# Patient Record
Sex: Female | Born: 1945 | Race: White | Hispanic: No | Smoking: Former smoker
Health system: Southern US, Community
[De-identification: ages and names within clinical notes are randomized; demographics above are authoritative.]

## PROBLEM LIST (undated history)

## (undated) DIAGNOSIS — J189 Pneumonia, unspecified organism: Secondary | ICD-10-CM

## (undated) DIAGNOSIS — R519 Headache, unspecified: Secondary | ICD-10-CM

## (undated) DIAGNOSIS — R011 Cardiac murmur, unspecified: Secondary | ICD-10-CM

## (undated) DIAGNOSIS — I10 Essential (primary) hypertension: Secondary | ICD-10-CM

## (undated) DIAGNOSIS — Z5189 Encounter for other specified aftercare: Secondary | ICD-10-CM

## (undated) DIAGNOSIS — C50919 Malignant neoplasm of unspecified site of unspecified female breast: Secondary | ICD-10-CM

## (undated) DIAGNOSIS — IMO0002 Reserved for concepts with insufficient information to code with codable children: Secondary | ICD-10-CM

## (undated) DIAGNOSIS — D649 Anemia, unspecified: Secondary | ICD-10-CM

## (undated) DIAGNOSIS — M1990 Unspecified osteoarthritis, unspecified site: Secondary | ICD-10-CM

## (undated) HISTORY — DX: Encounter for other specified aftercare: Z51.89

## (undated) HISTORY — PX: LUMBAR DISC SURGERY: SHX700

## (undated) HISTORY — DX: Malignant neoplasm of unspecified site of unspecified female breast: C50.919

## (undated) HISTORY — PX: KNEE SURGERY: SHX244

## (undated) HISTORY — DX: Anemia, unspecified: D64.9

## (undated) HISTORY — PX: ABDOMINAL HYSTERECTOMY: SHX81

## (undated) HISTORY — PX: BACK SURGERY: SHX140

## (undated) HISTORY — PX: APPENDECTOMY: SHX54

## (undated) HISTORY — DX: Reserved for concepts with insufficient information to code with codable children: IMO0002

## (undated) HISTORY — DX: Essential (primary) hypertension: I10

## (undated) HISTORY — DX: Cardiac murmur, unspecified: R01.1

## (undated) HISTORY — DX: Unspecified osteoarthritis, unspecified site: M19.90

---

## 1974-05-25 HISTORY — PX: ABDOMINAL HYSTERECTOMY: SHX81

## 1974-05-25 HISTORY — PX: APPENDECTOMY: SHX54

## 1993-05-25 HISTORY — PX: TIBIA FRACTURE SURGERY: SHX806

## 1993-05-25 HISTORY — PX: FRACTURE SURGERY: SHX138

## 1997-11-18 ENCOUNTER — Emergency Department (HOSPITAL_COMMUNITY): Admission: EM | Admit: 1997-11-18 | Discharge: 1997-11-18 | Payer: Self-pay | Admitting: Emergency Medicine

## 2005-12-03 ENCOUNTER — Ambulatory Visit (HOSPITAL_COMMUNITY): Admission: RE | Admit: 2005-12-03 | Discharge: 2005-12-04 | Payer: Self-pay | Admitting: Neurosurgery

## 2008-07-11 ENCOUNTER — Ambulatory Visit (HOSPITAL_COMMUNITY): Admission: RE | Admit: 2008-07-11 | Discharge: 2008-07-11 | Payer: Self-pay | Admitting: Gastroenterology

## 2010-05-25 DIAGNOSIS — K219 Gastro-esophageal reflux disease without esophagitis: Secondary | ICD-10-CM

## 2010-05-25 HISTORY — DX: Gastro-esophageal reflux disease without esophagitis: K21.9

## 2010-09-09 LAB — CROSSMATCH
ABO/RH(D): O POS
Antibody Screen: NEGATIVE

## 2010-09-09 LAB — ABO/RH: ABO/RH(D): O POS

## 2010-09-09 LAB — GLUCOSE, CAPILLARY: Glucose-Capillary: 110 mg/dL — ABNORMAL HIGH (ref 70–99)

## 2011-05-27 ENCOUNTER — Telehealth: Payer: Self-pay | Admitting: Hematology and Oncology

## 2011-05-27 NOTE — Telephone Encounter (Signed)
lmonvm for pt re calling me for appt w/LO 

## 2011-05-28 ENCOUNTER — Telehealth: Payer: Self-pay | Admitting: Hematology and Oncology

## 2011-05-28 NOTE — Telephone Encounter (Signed)
Pt returned my call and was given appt for 1/11 @ 9:30 am w/LO.

## 2011-05-29 ENCOUNTER — Other Ambulatory Visit: Payer: Self-pay | Admitting: Gastroenterology

## 2011-05-29 ENCOUNTER — Telehealth: Payer: Self-pay | Admitting: Hematology and Oncology

## 2011-05-29 ENCOUNTER — Ambulatory Visit
Admission: RE | Admit: 2011-05-29 | Discharge: 2011-05-29 | Disposition: A | Discharge status: Home / Self Care | Payer: Medicare Other | Source: Ambulatory Visit | Attending: Gastroenterology | Admitting: Gastroenterology

## 2011-05-29 DIAGNOSIS — Z5189 Encounter for other specified aftercare: Secondary | ICD-10-CM | POA: Diagnosis not present

## 2011-05-29 DIAGNOSIS — D649 Anemia, unspecified: Secondary | ICD-10-CM

## 2011-05-29 DIAGNOSIS — T189XXA Foreign body of alimentary tract, part unspecified, initial encounter: Secondary | ICD-10-CM

## 2011-05-29 NOTE — Telephone Encounter (Signed)
Referred by Dr. Charlott Rakes Dx- IDA

## 2011-06-05 ENCOUNTER — Ambulatory Visit: Payer: Medicare Other

## 2011-06-05 ENCOUNTER — Other Ambulatory Visit: Payer: Self-pay | Admitting: Hematology and Oncology

## 2011-06-05 ENCOUNTER — Ambulatory Visit (HOSPITAL_BASED_OUTPATIENT_CLINIC_OR_DEPARTMENT_OTHER): Payer: Medicare Other | Admitting: Hematology and Oncology

## 2011-06-05 ENCOUNTER — Ambulatory Visit (HOSPITAL_BASED_OUTPATIENT_CLINIC_OR_DEPARTMENT_OTHER): Payer: Medicare Other

## 2011-06-05 ENCOUNTER — Telehealth: Payer: Self-pay | Admitting: Hematology and Oncology

## 2011-06-05 VITALS — BP 169/76 | HR 85 | Temp 98.2°F | Ht 61.5 in | Wt 174.9 lb

## 2011-06-05 DIAGNOSIS — D509 Iron deficiency anemia, unspecified: Secondary | ICD-10-CM

## 2011-06-05 DIAGNOSIS — Z8719 Personal history of other diseases of the digestive system: Secondary | ICD-10-CM | POA: Diagnosis not present

## 2011-06-05 DIAGNOSIS — D539 Nutritional anemia, unspecified: Secondary | ICD-10-CM | POA: Diagnosis not present

## 2011-06-05 LAB — CBC & DIFF AND RETIC
BASO%: 0.8 % (ref 0.0–2.0)
Basophils Absolute: 0.1 10e3/uL (ref 0.0–0.1)
EOS%: 1.3 % (ref 0.0–7.0)
Eosinophils Absolute: 0.1 10e3/uL (ref 0.0–0.5)
HCT: 28.3 % — ABNORMAL LOW (ref 34.8–46.6)
HGB: 8 g/dL — ABNORMAL LOW (ref 11.6–15.9)
Immature Retic Fract: 31.4 % — ABNORMAL HIGH (ref 1.60–10.00)
LYMPH%: 28.5 % (ref 14.0–49.7)
MCH: 19.5 pg — ABNORMAL LOW (ref 25.1–34.0)
MCHC: 28.3 g/dL — ABNORMAL LOW (ref 31.5–36.0)
MCV: 68.9 fL — ABNORMAL LOW (ref 79.5–101.0)
MONO#: 0.4 10e3/uL (ref 0.1–0.9)
MONO%: 6.5 % (ref 0.0–14.0)
NEUT#: 3.9 10e3/uL (ref 1.5–6.5)
NEUT%: 62.9 % (ref 38.4–76.8)
Platelets: 360 10e3/uL (ref 145–400)
RBC: 4.11 10e6/uL (ref 3.70–5.45)
RDW: 19.7 % — ABNORMAL HIGH (ref 11.2–14.5)
Retic %: 2.55 % — ABNORMAL HIGH (ref 0.70–2.10)
Retic Ct Abs: 104.81 10e3/uL — ABNORMAL HIGH (ref 33.70–90.70)
WBC: 6.2 10e3/uL (ref 3.9–10.3)
lymph#: 1.8 10e3/uL (ref 0.9–3.3)
nRBC: 0 % (ref 0–0)

## 2011-06-05 LAB — URINALYSIS, MICROSCOPIC - CHCC
Bilirubin (Urine): NEGATIVE
Blood: NEGATIVE
Glucose: NEGATIVE g/dL
Ketones: NEGATIVE mg/dL
Leukocyte Esterase: NEGATIVE
Nitrite: NEGATIVE
Protein: <30 mg/dL
RBC count: NEGATIVE (ref 0–2)
Specific Gravity, Urine: 1.025 (ref 1.003–1.035)
pH: 6 (ref 4.6–8.0)

## 2011-06-05 NOTE — Telephone Encounter (Signed)
gv pt appt schedule for jan thru march. Pt sent back to lab

## 2011-06-05 NOTE — Progress Notes (Signed)
This office note has been dictated.

## 2011-06-05 NOTE — Progress Notes (Signed)
Dr.      Romie Levee      -     Primary  @ [facility]  At  [facility]. Dr.      Roderic Scarce       -       GI. Dr/      Judie Petit.    [place]       -     Endocrinologist.  [facility]    On    [address]   Son      Paul Half     Phone       [phone].

## 2011-06-06 NOTE — Progress Notes (Signed)
CC:   Alisha Showers, MD Shirley Friar, MD  IDENTIFYING STATEMENT:  The patient is a 66 year old woman seen at the request of Dr. Clent Ridges with anemia.  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 She was been treated for a torn ligament at the time. Her disease was  managed medically.  She recalls receiving two units of packed red blood cells.  This summer, she began to feel GI symptoms with abdominal discomfort.  She was found to be anemic and began prescription iron in September of 2012.  She was also placed on antacids.  She has not noted any frank active bleeding.  She has had intentional weight loss of 10 pounds.  She eats a well-balanced Diet and denies a family history for anemia.    I reviewed her previous blood counts:  03/26/2011 white cell count 6.2, hemoglobin 8.2,hematocrit 27.9, and platelets 360; on 04/30/2011, white cell count 5, hemoglobin 8.6, hematocrit 28.4, and platelets 408.  Results for iron studies were not available.  She has received a GI evaluation; A colonoscopy performed on 04/30/2011 had noted diverticula in the sigmoid colon with internal hemorrhoids.  An endoscopy on the same date had noted a medium-sized hiatal hernia and mucosal abnormality in the pylorus characterized by congestion.  Capsule endoscopy on 05/03/2011 had shown two small nonbleeding angiectasias with proximal small bowel erosions.  There were no documented areas of active bleeding.  The patient is referred for hematological evaluation.  She is not symptomatic.   PAST MEDICAL HISTORY: 1. Type 2 diabetes. 2. History of migraines. 3. Hypertension. 4. Hyperlipidemia. 5. Osteoporosis. 6. Acid reflux. 7. Status post hysterectomy at age 58 for endometriosis. 8. Status post back surgery x2.  ALLERGIES:  Codeine.  MEDICATIONS:  Aspirin 81 mg daily, Benicar/hydrochlorothiazide  20/12.5 half a tablet in the a.m., calcium 600 mg t.i.d., multivitamins, Prilosec 20 mg daily, ferrous sulfate 325 mg b.i.d., vitamin C 500 mg b.i.d., fish oil, Actos 30 mg daily, Lipitor 20 mg daily, Zetia 10 mg daily, vitamin D3 10,000 units twice a week, Fosamax 70 mg weekly, Maxalt 10 mg p.r.n. migraines, and Fortamet ER 1000 mg daily.  SOCIAL HISTORY:  The patient is widowed.  She has three grown children. She is retired from [employer].  She denies tobacco use but drinks occasional wine.  FAMILY HISTORY:  The patient's mother had ovarian cancer.  Denies a family history for anemia, hematological disorders.  HEALTH MAINTENANCE:  Colonoscopy, endoscopy documented above.  Mammogram is due.  REVIEW OF SYSTEMS:  Constitutional:  Denies fever, chills, night sweats, anorexia, weight loss.  Cardiovascular:  Denies chest pain, PND, orthopnea, ankle swelling.  Respiration:  Denies cough, hemoptysis, wheeze, shortness of breath.  GI:  Denies nausea, vomiting, abdominal pain, diarrhea, melena, hematochezia.  GU:  Denies dysuria, hematuria, nocturia, frequency.  Musculoskeletal:  Denies joint aches, muscle pains.  Neurologic:  Denies headaches, vision change, extremity weakness.  Skin:  No bruising or bleeding.  Neurologic:  Denies headache, vision changes, extremity weakness.  The rest of the review of systems is negative.  PHYSICAL EXAMINATION:  General Appearance:  The patient is a well- appearing, well-nourished woman in no distress.  Vitals:  Pulse 85, blood pressure 169/76, temperature 98.2, respirations 20, weight 174 pounds.  HEENT:  Head is atraumatic, normocephalic.  Sclera anicteric. Pupils are equal, round and reactive to light.  Mouth moist without ulcerations,  thrush, or lesions.  Neck:  Supple without adenopathy and trachea in center.  Chest demonstrates good entry bilaterally.  Clear to both percussion and auscultation.  Cardiovascular:  Exam reveals first and  second heart sounds present.  No added sounds or murmurs.  Abdomen: Soft, nontender.  Bowel sounds present.  Extremities:  No edema.  Pulses present and symmetrical.  Lymph Nodes:  No adenopathy.  CNS:  Nonfocal.  IMPRESSION AND PLAN:  Alisha Delgado is a pleasant 66 year old woman with a history of iron deficiency anemia.  She is known to have a history of gastrointestinal bleed.  She is on oral iron.  Her most recent hemoglobin and hematocrit are low.  She is a candidate for IV iron if her ferritin levels are low.  We plan for her to obtain a CBC with differential and anemia panel.  If ferritin levels are low normal, she will be invited to receive IV iron in the form of Feraheme next week. We discussed the logistics and side effects of therapy which were primarily infusional.  The patient will also obtain additional workup to rule out hemolysis with coombs, haptoglobin and LDH.  She also obtained a urinalysis performed.  We will telephone her with results. She follows up in 4-6 weeks time. I spent more than half the time discussing diagnosis and coordinating care.    ______________________________ Laurice Record, M.D. LIO/MEDQ  D:  06/05/2011  T:  06/06/2011  Job:  [id number]

## 2011-06-09 LAB — HAPTOGLOBIN: Haptoglobin: 176 mg/dL (ref 30–200)

## 2011-06-09 LAB — IRON AND TIBC
%SAT: 2 % — ABNORMAL LOW (ref 20–55)
Iron: 12 ug/dL — ABNORMAL LOW (ref 42–145)
TIBC: 513 ug/dL — ABNORMAL HIGH (ref 250–470)
UIBC: 501 ug/dL — ABNORMAL HIGH (ref 125–400)

## 2011-06-09 LAB — COMPREHENSIVE METABOLIC PANEL
ALT: 26 U/L (ref 0–35)
AST: 28 U/L (ref 0–37)
Albumin: 4.4 g/dL (ref 3.5–5.2)
Alkaline Phosphatase: 84 U/L (ref 39–117)
BUN: 17 mg/dL (ref 6–23)
CO2: 23 mEq/L (ref 19–32)
Calcium: 8.8 mg/dL (ref 8.4–10.5)
Chloride: 105 mEq/L (ref 96–112)
Creatinine, Ser: 0.63 mg/dL (ref 0.50–1.10)
Glucose, Bld: 138 mg/dL — ABNORMAL HIGH (ref 70–99)
Potassium: 4.2 mEq/L (ref 3.5–5.3)
Sodium: 140 mEq/L (ref 135–145)
Total Bilirubin: 0.4 mg/dL (ref 0.3–1.2)
Total Protein: 6.8 g/dL (ref 6.0–8.3)

## 2011-06-09 LAB — FOLATE: Folate: >20 ng/mL

## 2011-06-09 LAB — SPEP & IFE WITH QIG
Albumin ELP: 60 % (ref 55.8–66.1)
Alpha-1-Globulin: 5 % — ABNORMAL HIGH (ref 2.9–4.9)
Alpha-2-Globulin: 11.7 % (ref 7.1–11.8)
Beta 2: 4.1 % (ref 3.2–6.5)
Beta Globulin: 7.8 % — ABNORMAL HIGH (ref 4.7–7.2)
Gamma Globulin: 11.4 % (ref 11.1–18.8)
IgA: 116 mg/dL (ref 69–380)
IgG (Immunoglobin G), Serum: 776 mg/dL (ref 690–1700)
IgM, Serum: 136 mg/dL (ref 52–322)
Total Protein, Serum Electrophoresis: 6.8 g/dL (ref 6.0–8.3)

## 2011-06-09 LAB — VITAMIN B12: Vitamin B-12: 358 pg/mL (ref 211–911)

## 2011-06-09 LAB — FERRITIN: Ferritin: 16 ng/mL (ref 10–291)

## 2011-06-12 ENCOUNTER — Ambulatory Visit (HOSPITAL_BASED_OUTPATIENT_CLINIC_OR_DEPARTMENT_OTHER): Payer: Medicare Other

## 2011-06-12 VITALS — BP 120/85 | HR 94 | Temp 98.0°F

## 2011-06-12 DIAGNOSIS — D539 Nutritional anemia, unspecified: Secondary | ICD-10-CM | POA: Diagnosis not present

## 2011-06-12 MED ORDER — SODIUM CHLORIDE 0.9 % IV SOLN
1020.0000 mg | Freq: Once | INTRAVENOUS | Status: AC
  Administered 2011-06-12: 1020 mg via INTRAVENOUS
  Filled 2011-06-12: qty 34

## 2011-06-12 MED ORDER — SODIUM CHLORIDE 0.9 % IJ SOLN
10.0000 mL | INTRAMUSCULAR | Status: DC | PRN
  Filled 2011-06-12: qty 10

## 2011-06-12 MED ORDER — SODIUM CHLORIDE 0.9 % IV SOLN
Freq: Once | INTRAVENOUS | Status: AC
  Administered 2011-06-12: 250 mL via INTRAVENOUS

## 2011-06-12 NOTE — Progress Notes (Signed)
At 1550, VSS,denies distress after fereheme

## 2011-06-19 DIAGNOSIS — E785 Hyperlipidemia, unspecified: Secondary | ICD-10-CM | POA: Diagnosis not present

## 2011-06-19 DIAGNOSIS — E669 Obesity, unspecified: Secondary | ICD-10-CM | POA: Diagnosis not present

## 2011-06-19 DIAGNOSIS — I10 Essential (primary) hypertension: Secondary | ICD-10-CM | POA: Diagnosis not present

## 2011-06-19 DIAGNOSIS — IMO0001 Reserved for inherently not codable concepts without codable children: Secondary | ICD-10-CM | POA: Diagnosis not present

## 2011-06-19 DIAGNOSIS — D509 Iron deficiency anemia, unspecified: Secondary | ICD-10-CM | POA: Diagnosis not present

## 2011-07-21 ENCOUNTER — Other Ambulatory Visit (HOSPITAL_BASED_OUTPATIENT_CLINIC_OR_DEPARTMENT_OTHER): Payer: Medicare Other | Admitting: Lab

## 2011-07-21 DIAGNOSIS — D539 Nutritional anemia, unspecified: Secondary | ICD-10-CM

## 2011-07-21 LAB — IRON AND TIBC
%SAT: 12 % — ABNORMAL LOW (ref 20–55)
Iron: 36 ug/dL — ABNORMAL LOW (ref 42–145)
TIBC: 310 ug/dL (ref 250–470)
UIBC: 274 ug/dL (ref 125–400)

## 2011-07-21 LAB — FERRITIN: Ferritin: 66 ng/mL (ref 10–291)

## 2011-07-21 LAB — CBC WITH DIFFERENTIAL/PLATELET
BASO%: 0.5 % (ref 0.0–2.0)
Basophils Absolute: 0 10*3/uL (ref 0.0–0.1)
EOS%: 1.6 % (ref 0.0–7.0)
Eosinophils Absolute: 0.1 10*3/uL (ref 0.0–0.5)
HCT: 40.4 % (ref 34.8–46.6)
HGB: 13.2 g/dL (ref 11.6–15.9)
LYMPH%: 34.1 % (ref 14.0–49.7)
MCH: 26.5 pg (ref 25.1–34.0)
MCHC: 32.8 g/dL (ref 31.5–36.0)
MCV: 80.9 fL (ref 79.5–101.0)
MONO#: 0.4 10*3/uL (ref 0.1–0.9)
MONO%: 5.7 % (ref 0.0–14.0)
NEUT#: 3.7 10*3/uL (ref 1.5–6.5)
NEUT%: 58.1 % (ref 38.4–76.8)
Platelets: 266 10*3/uL (ref 145–400)
RBC: 4.99 10*6/uL (ref 3.70–5.45)
RDW: 30.8 % — ABNORMAL HIGH (ref 11.2–14.5)
WBC: 6.4 10*3/uL (ref 3.9–10.3)
lymph#: 2.2 10*3/uL (ref 0.9–3.3)

## 2011-07-21 LAB — BASIC METABOLIC PANEL
BUN: 20 mg/dL (ref 6–23)
CO2: 22 mEq/L (ref 19–32)
Calcium: 9.3 mg/dL (ref 8.4–10.5)
Chloride: 106 mEq/L (ref 96–112)
Creatinine, Ser: 0.79 mg/dL (ref 0.50–1.10)
Glucose, Bld: 91 mg/dL (ref 70–99)
Potassium: 4.3 mEq/L (ref 3.5–5.3)
Sodium: 140 mEq/L (ref 135–145)

## 2011-07-21 LAB — FOLATE: Folate: >20 ng/mL

## 2011-07-21 LAB — VITAMIN B12: Vitamin B-12: 483 pg/mL (ref 211–911)

## 2011-07-24 ENCOUNTER — Ambulatory Visit (HOSPITAL_BASED_OUTPATIENT_CLINIC_OR_DEPARTMENT_OTHER): Payer: Medicare Other | Admitting: Hematology and Oncology

## 2011-07-24 ENCOUNTER — Telehealth: Payer: Self-pay | Admitting: Hematology and Oncology

## 2011-07-24 ENCOUNTER — Encounter: Payer: Self-pay | Admitting: Hematology and Oncology

## 2011-07-24 VITALS — BP 164/86 | HR 79 | Temp 97.4°F | Ht 61.5 in | Wt 176.2 lb

## 2011-07-24 DIAGNOSIS — D500 Iron deficiency anemia secondary to blood loss (chronic): Secondary | ICD-10-CM

## 2011-07-24 DIAGNOSIS — K922 Gastrointestinal hemorrhage, unspecified: Secondary | ICD-10-CM

## 2011-07-24 DIAGNOSIS — D539 Nutritional anemia, unspecified: Secondary | ICD-10-CM

## 2011-07-24 NOTE — Progress Notes (Signed)
This office note has been dictated.

## 2011-07-24 NOTE — Telephone Encounter (Signed)
appt made and printed for 8/2 and 8/6   aom

## 2011-07-24 NOTE — Patient Instructions (Signed)
Patient to follow up as instructed.  

## 2011-07-24 NOTE — Progress Notes (Signed)
CC:   Elby Showers, MD Shirley Friar, MD  IDENTIFYING STATEMENT:  The patient is a 66 year old woman with iron- deficiency anemia who presents for followup.  INTERVAL HISTORY:  Ms. Nuncio is known to have iron-deficiency anemia and has had a history of GI bleed.  Her ferritin level was low at 16 and she received IV iron in the form of Feraheme on 06/12/2011, which she tolerated well.  Additional hematology workup showed no evidence of hemolysis.  B12 was normal at 483, serum folic acid was greater than 20. There was no evidence of myelodyscrasia.  MEDICATIONS:  Medications reviewed and updated.  ALLERGIES:  Codeine.  PHYSICAL EXAMINATION:  General:  The patient is alert and oriented x3. Vitals:  Pulse 79, blood pressure 164/88, temperature 97.4, respirations 20, weight 176 pounds.  HEENT:  Head is atraumatic, normocephalic. Mouth moist.  Chest:  Clear.  Abdomen:  Soft.  Extremities:  No edema.  LABORATORY DATA:  On 07/21/2011 white cell count 6.4, hemoglobin 13.3, hematocrit 40.4, platelets 266.  Ferritin 66, iron 36, TIBC 310, saturation 12%, B12 483.  Sodium 140, potassium 4.3, chloride 106, CO2 of 22, BUN 20, creatinine 0.79, glucose 91, calcium 9.3.  IMPRESSION AND PLAN:  Ms. Helming is a 66 year old woman with known history of iron-deficiency anemia with history of gastrointestinal bleed.  Her ferritin level has improved following Feraheme.  She is to continue taking oral iron.  She follows up in four months time.      ______________________________ Laurice Record, M.D. LIO/MEDQ  D:  07/24/2011  T:  07/24/2011  Job:  [id number]

## 2011-10-29 DIAGNOSIS — D509 Iron deficiency anemia, unspecified: Secondary | ICD-10-CM | POA: Diagnosis not present

## 2011-10-29 DIAGNOSIS — Z1331 Encounter for screening for depression: Secondary | ICD-10-CM | POA: Diagnosis not present

## 2011-10-29 DIAGNOSIS — Z23 Encounter for immunization: Secondary | ICD-10-CM | POA: Diagnosis not present

## 2011-10-29 DIAGNOSIS — I10 Essential (primary) hypertension: Secondary | ICD-10-CM | POA: Diagnosis not present

## 2011-10-29 DIAGNOSIS — E669 Obesity, unspecified: Secondary | ICD-10-CM | POA: Diagnosis not present

## 2011-10-29 DIAGNOSIS — E119 Type 2 diabetes mellitus without complications: Secondary | ICD-10-CM | POA: Diagnosis not present

## 2011-10-29 DIAGNOSIS — M810 Age-related osteoporosis without current pathological fracture: Secondary | ICD-10-CM | POA: Diagnosis not present

## 2011-11-03 DIAGNOSIS — M899 Disorder of bone, unspecified: Secondary | ICD-10-CM | POA: Diagnosis not present

## 2011-11-03 DIAGNOSIS — Z1231 Encounter for screening mammogram for malignant neoplasm of breast: Secondary | ICD-10-CM | POA: Diagnosis not present

## 2011-11-03 DIAGNOSIS — M949 Disorder of cartilage, unspecified: Secondary | ICD-10-CM | POA: Diagnosis not present

## 2011-11-06 DIAGNOSIS — R928 Other abnormal and inconclusive findings on diagnostic imaging of breast: Secondary | ICD-10-CM | POA: Diagnosis not present

## 2011-12-21 ENCOUNTER — Telehealth: Payer: Self-pay | Admitting: Hematology and Oncology

## 2011-12-21 NOTE — Telephone Encounter (Signed)
Pt called today to cancel her lb/fu appts for 8/2 and 8/6. Per pt she is doing very well and she is going to follow up w/her primary. Pt asks that I give her regards to LO and thank her for everything. Message to LO.

## 2011-12-25 ENCOUNTER — Other Ambulatory Visit: Payer: Medicare Other | Admitting: Lab

## 2011-12-25 ENCOUNTER — Telehealth: Payer: Self-pay | Admitting: Hematology and Oncology

## 2011-12-25 NOTE — Telephone Encounter (Signed)
Per response from LO via staff message ok to cx appts.

## 2011-12-29 ENCOUNTER — Ambulatory Visit: Payer: Medicare Other | Admitting: Hematology and Oncology

## 2012-01-28 DIAGNOSIS — Z23 Encounter for immunization: Secondary | ICD-10-CM | POA: Diagnosis not present

## 2012-01-28 DIAGNOSIS — E669 Obesity, unspecified: Secondary | ICD-10-CM | POA: Diagnosis not present

## 2012-01-28 DIAGNOSIS — IMO0001 Reserved for inherently not codable concepts without codable children: Secondary | ICD-10-CM | POA: Diagnosis not present

## 2012-01-28 DIAGNOSIS — I10 Essential (primary) hypertension: Secondary | ICD-10-CM | POA: Diagnosis not present

## 2012-01-28 DIAGNOSIS — D509 Iron deficiency anemia, unspecified: Secondary | ICD-10-CM | POA: Diagnosis not present

## 2012-01-28 DIAGNOSIS — M810 Age-related osteoporosis without current pathological fracture: Secondary | ICD-10-CM | POA: Diagnosis not present

## 2012-04-28 DIAGNOSIS — IMO0001 Reserved for inherently not codable concepts without codable children: Secondary | ICD-10-CM | POA: Diagnosis not present

## 2012-04-28 DIAGNOSIS — I10 Essential (primary) hypertension: Secondary | ICD-10-CM | POA: Diagnosis not present

## 2012-04-28 DIAGNOSIS — E785 Hyperlipidemia, unspecified: Secondary | ICD-10-CM | POA: Diagnosis not present

## 2012-04-28 DIAGNOSIS — Z23 Encounter for immunization: Secondary | ICD-10-CM | POA: Diagnosis not present

## 2012-04-28 DIAGNOSIS — E669 Obesity, unspecified: Secondary | ICD-10-CM | POA: Diagnosis not present

## 2012-05-25 HISTORY — PX: EYE SURGERY: SHX253

## 2012-05-26 DIAGNOSIS — E119 Type 2 diabetes mellitus without complications: Secondary | ICD-10-CM | POA: Diagnosis not present

## 2012-08-01 DIAGNOSIS — H2510 Age-related nuclear cataract, unspecified eye: Secondary | ICD-10-CM | POA: Diagnosis not present

## 2012-08-04 DIAGNOSIS — E785 Hyperlipidemia, unspecified: Secondary | ICD-10-CM | POA: Diagnosis not present

## 2012-08-04 DIAGNOSIS — E119 Type 2 diabetes mellitus without complications: Secondary | ICD-10-CM | POA: Diagnosis not present

## 2012-08-04 DIAGNOSIS — I10 Essential (primary) hypertension: Secondary | ICD-10-CM | POA: Diagnosis not present

## 2012-09-19 DIAGNOSIS — H2510 Age-related nuclear cataract, unspecified eye: Secondary | ICD-10-CM | POA: Diagnosis not present

## 2012-09-19 DIAGNOSIS — H269 Unspecified cataract: Secondary | ICD-10-CM | POA: Diagnosis not present

## 2012-09-20 DIAGNOSIS — H2510 Age-related nuclear cataract, unspecified eye: Secondary | ICD-10-CM | POA: Diagnosis not present

## 2012-09-26 DIAGNOSIS — H2510 Age-related nuclear cataract, unspecified eye: Secondary | ICD-10-CM | POA: Diagnosis not present

## 2012-10-10 DIAGNOSIS — H2510 Age-related nuclear cataract, unspecified eye: Secondary | ICD-10-CM | POA: Diagnosis not present

## 2012-10-10 DIAGNOSIS — H269 Unspecified cataract: Secondary | ICD-10-CM | POA: Diagnosis not present

## 2012-10-18 DIAGNOSIS — H2510 Age-related nuclear cataract, unspecified eye: Secondary | ICD-10-CM | POA: Diagnosis not present

## 2013-03-07 DIAGNOSIS — Z0000 Encounter for general adult medical examination without abnormal findings: Secondary | ICD-10-CM | POA: Diagnosis not present

## 2013-03-07 DIAGNOSIS — E119 Type 2 diabetes mellitus without complications: Secondary | ICD-10-CM | POA: Diagnosis not present

## 2013-03-07 DIAGNOSIS — I10 Essential (primary) hypertension: Secondary | ICD-10-CM | POA: Diagnosis not present

## 2013-03-07 DIAGNOSIS — E785 Hyperlipidemia, unspecified: Secondary | ICD-10-CM | POA: Diagnosis not present

## 2013-03-10 DIAGNOSIS — Z23 Encounter for immunization: Secondary | ICD-10-CM | POA: Diagnosis not present

## 2013-03-10 DIAGNOSIS — Z1231 Encounter for screening mammogram for malignant neoplasm of breast: Secondary | ICD-10-CM | POA: Diagnosis not present

## 2013-04-17 DIAGNOSIS — R748 Abnormal levels of other serum enzymes: Secondary | ICD-10-CM | POA: Diagnosis not present

## 2013-04-17 DIAGNOSIS — R7989 Other specified abnormal findings of blood chemistry: Secondary | ICD-10-CM | POA: Diagnosis not present

## 2013-04-18 ENCOUNTER — Other Ambulatory Visit (HOSPITAL_COMMUNITY): Payer: Self-pay | Admitting: Internal Medicine

## 2013-04-18 DIAGNOSIS — R748 Abnormal levels of other serum enzymes: Secondary | ICD-10-CM

## 2013-04-18 DIAGNOSIS — R7989 Other specified abnormal findings of blood chemistry: Secondary | ICD-10-CM

## 2013-04-25 ENCOUNTER — Ambulatory Visit (HOSPITAL_COMMUNITY)
Admission: RE | Admit: 2013-04-25 | Discharge: 2013-04-25 | Disposition: A | Discharge status: Home / Self Care | Payer: Medicare Other | Source: Ambulatory Visit | Attending: Internal Medicine | Admitting: Internal Medicine

## 2013-04-25 DIAGNOSIS — R7989 Other specified abnormal findings of blood chemistry: Secondary | ICD-10-CM

## 2013-04-25 DIAGNOSIS — K7689 Other specified diseases of liver: Secondary | ICD-10-CM | POA: Diagnosis not present

## 2013-04-25 DIAGNOSIS — R748 Abnormal levels of other serum enzymes: Secondary | ICD-10-CM

## 2013-04-25 DIAGNOSIS — N281 Cyst of kidney, acquired: Secondary | ICD-10-CM | POA: Diagnosis not present

## 2013-05-25 DIAGNOSIS — S52502A Unspecified fracture of the lower end of left radius, initial encounter for closed fracture: Secondary | ICD-10-CM

## 2013-05-25 DIAGNOSIS — S2249XA Multiple fractures of ribs, unspecified side, initial encounter for closed fracture: Secondary | ICD-10-CM

## 2013-05-25 HISTORY — DX: Multiple fractures of ribs, unspecified side, initial encounter for closed fracture: S22.49XA

## 2013-05-25 HISTORY — DX: Unspecified fracture of the lower end of left radius, initial encounter for closed fracture: S52.502A

## 2013-06-07 DIAGNOSIS — R809 Proteinuria, unspecified: Secondary | ICD-10-CM | POA: Diagnosis not present

## 2013-06-07 DIAGNOSIS — E785 Hyperlipidemia, unspecified: Secondary | ICD-10-CM | POA: Diagnosis not present

## 2013-06-07 DIAGNOSIS — K7689 Other specified diseases of liver: Secondary | ICD-10-CM | POA: Diagnosis not present

## 2013-06-07 DIAGNOSIS — I10 Essential (primary) hypertension: Secondary | ICD-10-CM | POA: Diagnosis not present

## 2013-06-07 DIAGNOSIS — Z23 Encounter for immunization: Secondary | ICD-10-CM | POA: Diagnosis not present

## 2013-06-07 DIAGNOSIS — IMO0001 Reserved for inherently not codable concepts without codable children: Secondary | ICD-10-CM | POA: Diagnosis not present

## 2013-07-06 DIAGNOSIS — Z23 Encounter for immunization: Secondary | ICD-10-CM | POA: Diagnosis not present

## 2013-10-03 DIAGNOSIS — R809 Proteinuria, unspecified: Secondary | ICD-10-CM | POA: Diagnosis not present

## 2013-10-03 DIAGNOSIS — E119 Type 2 diabetes mellitus without complications: Secondary | ICD-10-CM | POA: Diagnosis not present

## 2013-10-03 DIAGNOSIS — E559 Vitamin D deficiency, unspecified: Secondary | ICD-10-CM | POA: Diagnosis not present

## 2013-10-03 DIAGNOSIS — R7989 Other specified abnormal findings of blood chemistry: Secondary | ICD-10-CM | POA: Diagnosis not present

## 2013-10-03 DIAGNOSIS — I10 Essential (primary) hypertension: Secondary | ICD-10-CM | POA: Diagnosis not present

## 2013-10-03 DIAGNOSIS — E785 Hyperlipidemia, unspecified: Secondary | ICD-10-CM | POA: Diagnosis not present

## 2013-12-05 DIAGNOSIS — Z23 Encounter for immunization: Secondary | ICD-10-CM | POA: Diagnosis not present

## 2014-01-01 IMAGING — US US ABDOMEN COMPLETE
1 series · 11 of 11 positions shown · non-contrast
Comparison: Abdominal series [DATE].

CLINICAL DATA: Elevated LFTs.

EXAM:
ULTRASOUND ABDOMEN COMPLETE

[Series 1: us abdomen complete · 0.28mm/px · 11 of 11 slices shown]
[im 1/11]
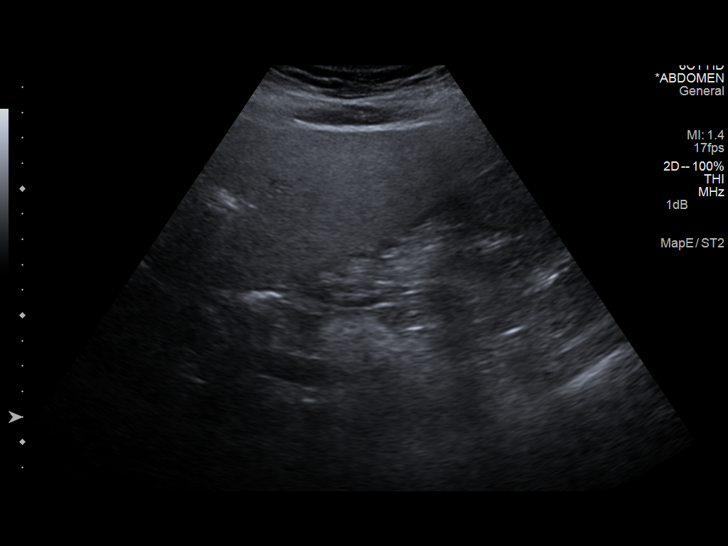
[im 2/11]
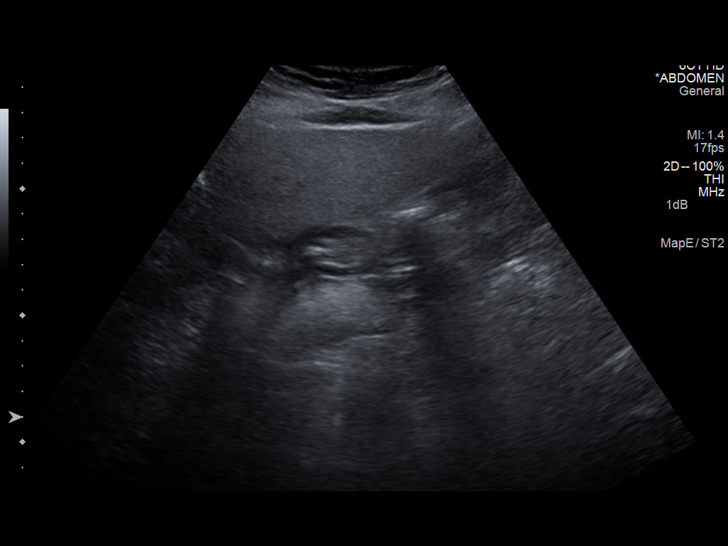
[im 3/11]
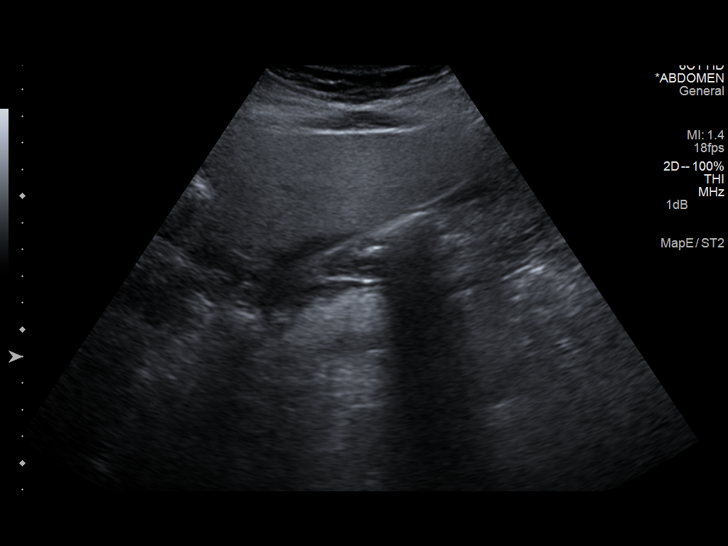
[im 4/11]
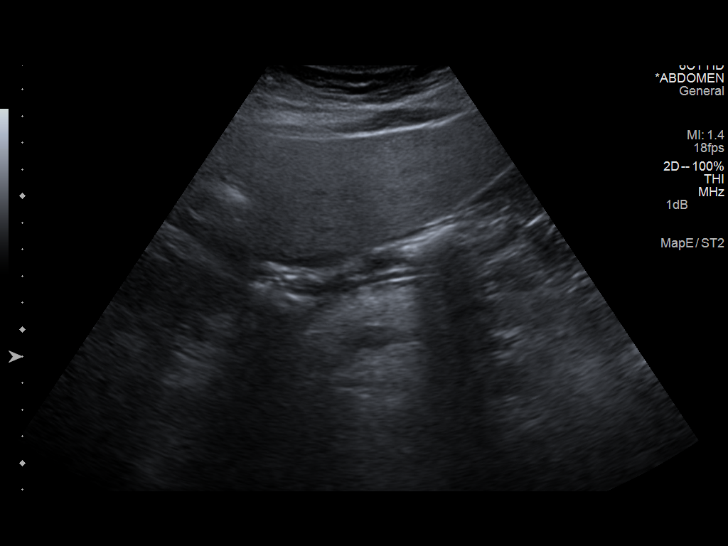
[im 5/11]
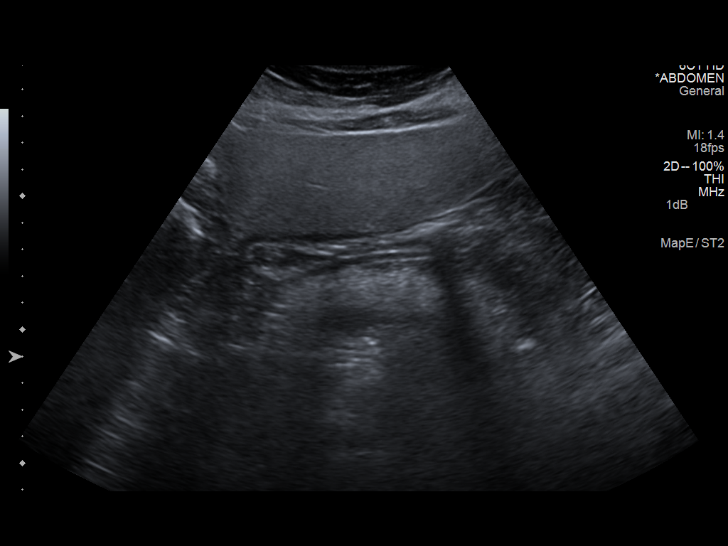
[im 6/11]
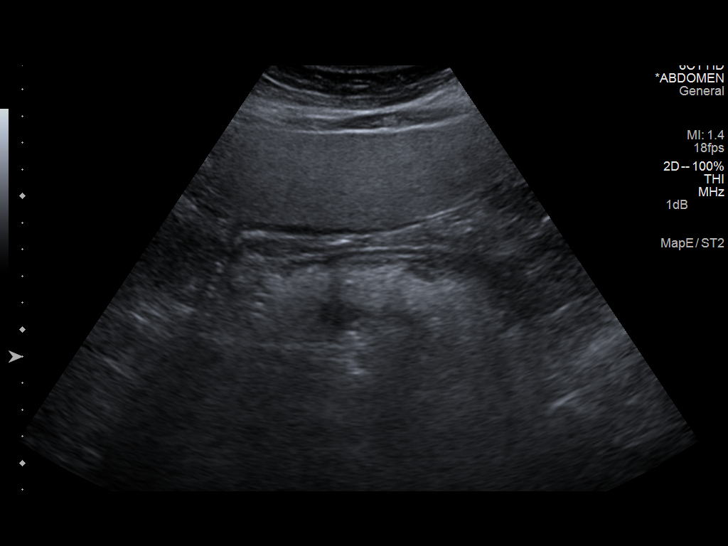
[im 7/11]
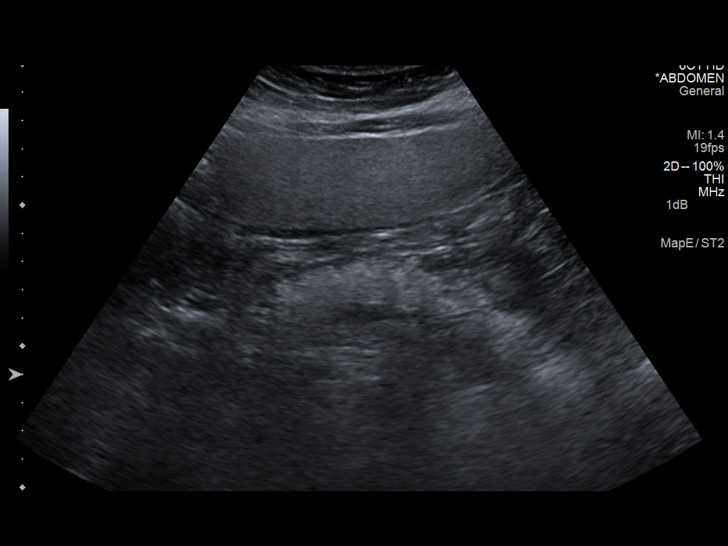
[im 8/11]
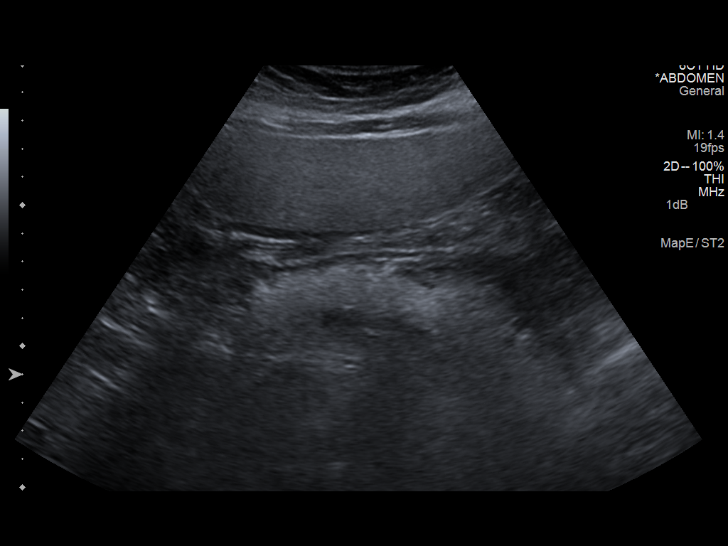
[im 9/11]
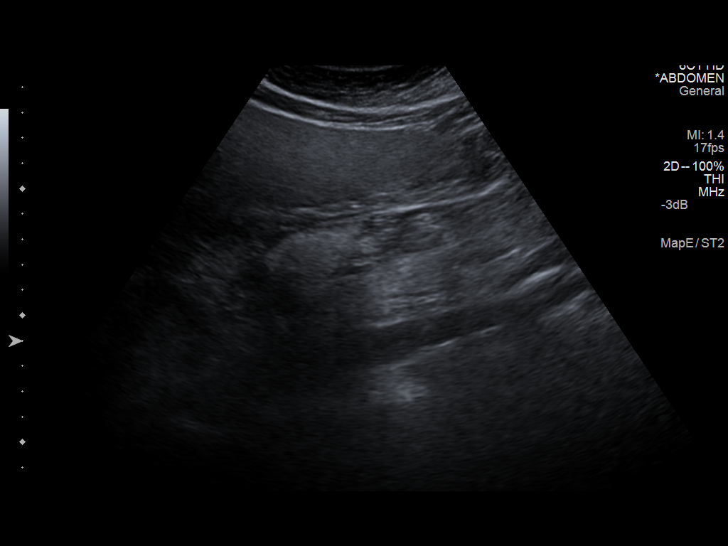
[im 10/11]
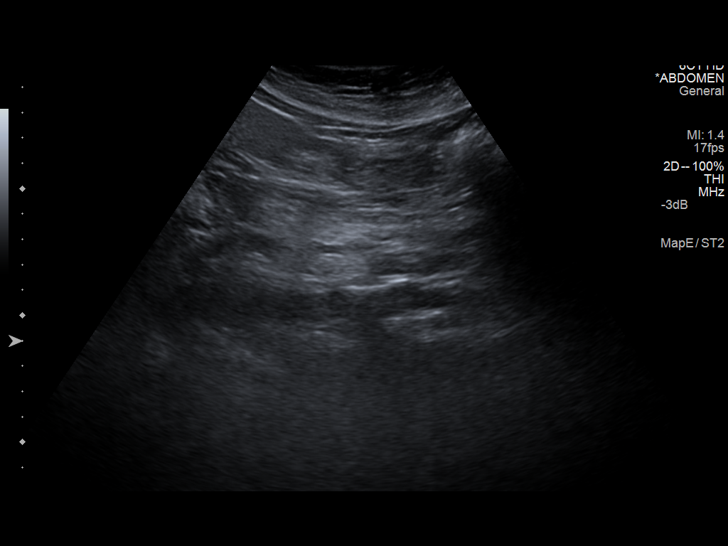
[im 11/11]
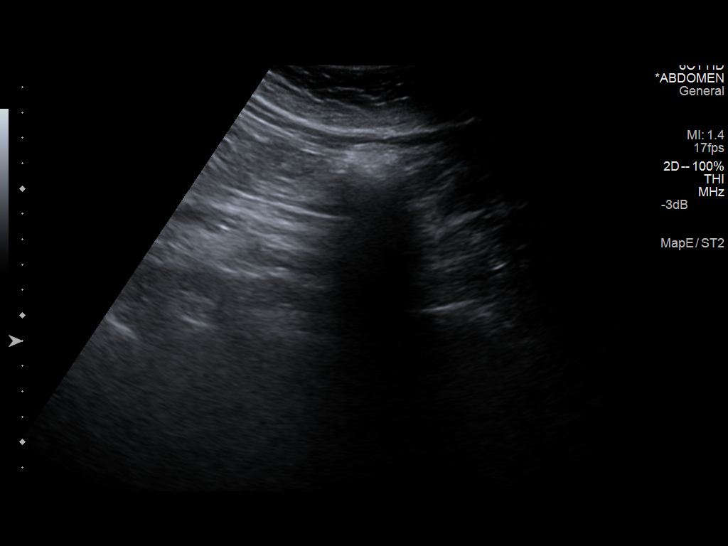

[11 of 11 positions shown; findings below may reference images not displayed]

FINDINGS: Gallbladder:

No gallstones or wall thickening visualized. No sonographic Murphy
sign noted.

Common bile duct:

Diameter: 4.3 mm.

Liver:

Diffuse increased echogenicity consistent with fatty infiltration.
No focal hepatic abnormality.

IVC:

No abnormality visualized.

Pancreas:

Visualized portion unremarkable.

Spleen:

Size and appearance within normal limits.

Right Kidney:

Length: 11.8 cm. 4 cm simple cyst right upper renal pole. Renal
echotexture normal. No hydronephrosis.

Left Kidney:

Length: 10.7 cm .  1.5 cm simple cyst midportion left kidney

Abdominal aorta:

No aneurysm visualized.

Other findings:

None.
IMPRESSION: Fatty infiltration of the liver. Otherwise no significant
abnormality.

## 2014-02-23 ENCOUNTER — Emergency Department (HOSPITAL_COMMUNITY): Payer: Medicare Other

## 2014-02-23 ENCOUNTER — Encounter (HOSPITAL_COMMUNITY): Payer: Self-pay | Admitting: Emergency Medicine

## 2014-02-23 ENCOUNTER — Emergency Department (HOSPITAL_COMMUNITY)
Admission: EM | Admit: 2014-02-23 | Discharge: 2014-02-23 | Disposition: A | Discharge status: Home / Self Care | Payer: Medicare Other | Attending: Emergency Medicine | Admitting: Emergency Medicine

## 2014-02-23 DIAGNOSIS — Z872 Personal history of diseases of the skin and subcutaneous tissue: Secondary | ICD-10-CM | POA: Diagnosis not present

## 2014-02-23 DIAGNOSIS — W010XXA Fall on same level from slipping, tripping and stumbling without subsequent striking against object, initial encounter: Secondary | ICD-10-CM | POA: Diagnosis not present

## 2014-02-23 DIAGNOSIS — Z79899 Other long term (current) drug therapy: Secondary | ICD-10-CM | POA: Diagnosis not present

## 2014-02-23 DIAGNOSIS — M810 Age-related osteoporosis without current pathological fracture: Secondary | ICD-10-CM | POA: Diagnosis not present

## 2014-02-23 DIAGNOSIS — M129 Arthropathy, unspecified: Secondary | ICD-10-CM | POA: Diagnosis not present

## 2014-02-23 DIAGNOSIS — Z87891 Personal history of nicotine dependence: Secondary | ICD-10-CM | POA: Insufficient documentation

## 2014-02-23 DIAGNOSIS — E119 Type 2 diabetes mellitus without complications: Secondary | ICD-10-CM | POA: Diagnosis not present

## 2014-02-23 DIAGNOSIS — S298XXA Other specified injuries of thorax, initial encounter: Secondary | ICD-10-CM | POA: Diagnosis not present

## 2014-02-23 DIAGNOSIS — I10 Essential (primary) hypertension: Secondary | ICD-10-CM | POA: Diagnosis not present

## 2014-02-23 DIAGNOSIS — S2249XA Multiple fractures of ribs, unspecified side, initial encounter for closed fracture: Secondary | ICD-10-CM | POA: Diagnosis not present

## 2014-02-23 DIAGNOSIS — S8002XA Contusion of left knee, initial encounter: Secondary | ICD-10-CM | POA: Insufficient documentation

## 2014-02-23 DIAGNOSIS — D649 Anemia, unspecified: Secondary | ICD-10-CM | POA: Insufficient documentation

## 2014-02-23 DIAGNOSIS — Y9389 Activity, other specified: Secondary | ICD-10-CM | POA: Insufficient documentation

## 2014-02-23 DIAGNOSIS — R011 Cardiac murmur, unspecified: Secondary | ICD-10-CM | POA: Diagnosis not present

## 2014-02-23 DIAGNOSIS — S52612A Displaced fracture of left ulna styloid process, initial encounter for closed fracture: Secondary | ICD-10-CM | POA: Diagnosis not present

## 2014-02-23 DIAGNOSIS — S8982XA Other specified injuries of left lower leg, initial encounter: Secondary | ICD-10-CM | POA: Diagnosis not present

## 2014-02-23 DIAGNOSIS — S6992XA Unspecified injury of left wrist, hand and finger(s), initial encounter: Secondary | ICD-10-CM | POA: Diagnosis present

## 2014-02-23 DIAGNOSIS — Z7982 Long term (current) use of aspirin: Secondary | ICD-10-CM | POA: Insufficient documentation

## 2014-02-23 DIAGNOSIS — Y9289 Other specified places as the place of occurrence of the external cause: Secondary | ICD-10-CM | POA: Diagnosis not present

## 2014-02-23 DIAGNOSIS — S52602A Unspecified fracture of lower end of left ulna, initial encounter for closed fracture: Secondary | ICD-10-CM | POA: Diagnosis not present

## 2014-02-23 DIAGNOSIS — S2242XA Multiple fractures of ribs, left side, initial encounter for closed fracture: Secondary | ICD-10-CM | POA: Diagnosis not present

## 2014-02-23 DIAGNOSIS — M25562 Pain in left knee: Secondary | ICD-10-CM | POA: Diagnosis not present

## 2014-02-23 DIAGNOSIS — H269 Unspecified cataract: Secondary | ICD-10-CM | POA: Insufficient documentation

## 2014-02-23 DIAGNOSIS — S52502A Unspecified fracture of the lower end of left radius, initial encounter for closed fracture: Secondary | ICD-10-CM

## 2014-02-23 DIAGNOSIS — W19XXXA Unspecified fall, initial encounter: Secondary | ICD-10-CM

## 2014-02-23 MED ORDER — OXYCODONE-ACETAMINOPHEN 5-325 MG PO TABS: 1.0000 | ORAL_TABLET | Freq: Four times a day (QID) | ORAL | Status: DC | PRN

## 2014-02-23 MED ORDER — OXYCODONE-ACETAMINOPHEN 5-325 MG PO TABS
2.0000 | ORAL_TABLET | Freq: Once | ORAL | Status: AC
  Administered 2014-02-23: 2 via ORAL
  Filled 2014-02-23: qty 2

## 2014-02-23 NOTE — ED Provider Notes (Signed)
Medical screening examination/treatment/procedure(s) were conducted as a shared visit with non-physician practitioner(s) and myself.  I personally evaluated the patient during the encounter.   EKG Interpretation None      68 year old female presenting after a fall.  She tripped over her doggy gate and landed on it on her left side.  On exam, well appearing, nontoxic, not distressed, normal respiratory effort, normal perfusion, left chest wall is tender to palpation without crepitus, lung sounds are clear bilaterally, heart sounds normal with regular rate and rhythm. Imaging revealed left wrist fracture and left sided rib fractures. I discussed admission with the patient, but she preferred to be discharged home with outpatient followup. She appears comfortable, think this is a reasonable approach. Given pain medication and incentive spirometry prior to her discharge.  She was given strict return precautions.  Clinical Impression: 1. Distal radius fracture, left, closed, initial encounter   2. Fracture of ulnar styloid, left, closed, initial encounter   3. Traumatic closed displaced fracture of multiple ribs, left, initial encounter   4. Fall, initial encounter   5. Knee contusion, left, initial encounter       Artis Delay, MD 02/23/14 [phone]

## 2014-02-23 NOTE — ED Provider Notes (Signed)
CSN: [id number]     Arrival date & time 02/23/14  1134 History  This chart was scribed for non-physician practitioner, Michele Mcalpine, PA-C, working with Artis Delay, MD by Ladene Artist, ED Scribe. This patient was seen in room TR06C/TR06C and the patient's care was started at 11:59 AM.   Chief Complaint  Patient presents with  . Fall   The history is provided by the patient. No language interpreter was used.   HPI Comments: Alisha Delgado is a 68 y.o. female who presents to the Emergency Department complaining of fall that occurred PTA. Pt states that she tripped over her dogs and the pet gate upon falling on her L side. She denies LOC or hitting her head. She reports associated pain to the L wrist, L knee and L ribs. She denies SOB. Pt currently rates her pain 8/10. Knee pain is exacerbated with walking. Pt has had L patellar reconstruction. No medications taken PTA.    Past Medical History  Diagnosis Date  . Anemia   . Arthritis   . Blood transfusion   . Cataract   . Diabetes mellitus   . Heart murmur   . Hypertension   . Osteoporosis   . Ulcer    Past Surgical History  Procedure Laterality Date  . Abdominal hysterectomy    . Back surgery    . Knee surgery    . Appendectomy     History reviewed. No pertinent family history. History  Substance Use Topics  . 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 Smokeless tobacco: Not on file  . Alcohol Use:    OB History   Grav Para Term Preterm Abortions TAB SAB Ect Mult Living                 Review of Systems  Respiratory: Negative for shortness of breath.   Musculoskeletal: Positive for arthralgias.       + Rib pain  Neurological: Negative for syncope.  All other systems reviewed and are negative.  Allergies  Codeine  Home Medications   Prior to Admission medications   Medication Sig Start Date End Date Taking? Authorizing Provider  alendronate (FOSAMAX) 70 MG tablet Take 70 mg by mouth every 7 (seven) days. Take with a full  glass of water on an empty stomach.    Historical Provider, MD  aspirin 81 MG tablet Take 81 mg by mouth daily.     Historical Provider, MD  atorvastatin (LIPITOR) 20 MG tablet Take 20 mg by mouth daily.    Historical Provider, MD  Calcium Carbonate-Vitamin D (CALCIUM 600 + D PO) Take 1 tablet by mouth 3 (three) times daily.    Historical Provider, MD  Cholecalciferol (VITAMIN D3) 3000 UNITS TABS Take 1,000 Units by mouth 2 (two) times a week. Take 1 tab  On  Tues. &  Thurs.    Historical Provider, MD  ezetimibe (ZETIA) 10 MG tablet Take 10 mg by mouth daily.    Historical Provider, MD  ferrous sulfate 325 (65 FE) MG tablet Take 325 mg by mouth 2 (two) times daily.    Historical Provider, MD  Javier Docker Oil 300 MG CAPS Take 300 mg by mouth daily.    Historical Provider, MD  metformin (FORTAMET) 1000 MG (OSM) 24 hr tablet Take 1,000 mg by mouth daily with breakfast.    Historical Provider, MD  Multiple Vitamin (MULTIVITAMIN) tablet Take 1 tablet by mouth daily.    Historical Provider, MD  olmesartan-hydrochlorothiazide (BENICAR HCT) 20-12.5 MG per  tablet Take 0.5 tablets by mouth daily.    Historical Provider, MD  omeprazole (PRILOSEC) 20 MG capsule Take 20 mg by mouth daily as needed.     Historical Provider, MD  oxyCODONE-acetaminophen (PERCOCET) 5-325 MG per tablet Take 1-2 tablets by mouth every 6 (six) hours as needed for severe pain. 02/23/14   Illene Labrador, PA-C  pioglitazone (ACTOS) 30 MG tablet Take 30 mg by mouth daily.    Historical Provider, MD  rizatriptan (MAXALT) 10 MG tablet Take 10 mg by mouth as needed. May repeat in 2 hours if needed    Historical Provider, MD  vitamin C (ASCORBIC ACID) 500 MG tablet Take 500 mg by mouth 2 (two) times daily.    Historical Provider, MD   Triage Vitals: BP 144/79  Pulse 76  Temp(Src) 98.8 F (37.1 C) (Oral)  Resp 15  Ht 5' 1.25" (1.556 m)  Wt 168 lb (76.204 kg)  BMI 31.47 kg/m2  SpO2 95% Physical Exam  Nursing note and vitals  reviewed. Constitutional: She is oriented to person, place, and time. She appears well-developed and well-nourished. No distress.  HENT:  Head: Normocephalic and atraumatic.  Mouth/Throat: Oropharynx is clear and moist.  Eyes: Conjunctivae and EOM are normal.  Neck: Normal range of motion. Neck supple.  Cardiovascular: Normal rate, regular rhythm and normal heart sounds.   Pulmonary/Chest: Effort normal and breath sounds normal. No respiratory distress. She has no decreased breath sounds.  TTP left anterolateral ribs. No bruising, crepitus or step-off.  Musculoskeletal: Normal range of motion. She exhibits no edema.  L wrist TTP distally, swelling throughout, more so ulnar aspect. Small deformity. +2 radial pulse. L knee TTP lateral aspect of patella with small abrasion and small amount of swelling. FROM, pain with flexion.  Neurological: She is alert and oriented to person, place, and time. No sensory deficit.  Skin: Skin is warm and dry.  Psychiatric: She has a normal mood and affect. Her behavior is normal.   ED Course  Procedures (including critical care time) DIAGNOSTIC STUDIES: Oxygen Saturation is 95% on RA, adequate by my interpretation.    COORDINATION OF CARE: 12:03 PM-Discussed treatment plan which includes XRs with pt at bedside and pt agreed to plan.   Labs Review Labs Reviewed - No data to display  Imaging Review Dg Ribs Unilateral W/chest Left  02/23/2014   CLINICAL DATA:  Status post fall today landing on left-sided body now complaining of left-sided rib pain anteriorly ; no difficulty breathing  EXAM: LEFT RIBS AND CHEST - 3+ VIEW  COMPARISON:  None.  FINDINGS: The lungs are adequately inflated. There is no focal infiltrate. The interstitial markings are coarse bilaterally. There is subtle nodular density projects over the posterior aspect of the right eighth rib. There is no pleural effusion or pneumothorax. The cardiopericardial silhouette is enlarged. There is  tortuosity of the descending thoracic aorta.  The left rib detail films reveal minimally displaced fractures of the anterior aspects of the left fifth, sixth, and seventh ribs.  IMPRESSION: 1. The patient has minimally displaced fractures of the anterior aspects of the left fifth, sixth, and seventh ribs. 2. There is no evidence of a pneumothorax or pleural effusion or pulmonary contusion. 3. There is mild enlargement of the cardiac silhouette without evidence of CHF. 4. A PA and lateral chest x-ray with deep inspiration would be useful to allow further evaluation of the coarse interstitial markings and subtle nodular density projecting over the right eighth rib.   Electronically  Signed   By: David  Martinique   On: 02/23/2014 13:50   Dg Wrist Complete Left  02/23/2014   CLINICAL DATA:  Status post fall today landing on the left side of the body and left wrist  EXAM: LEFT WRIST - COMPLETE 3+ VIEW  COMPARISON:  None.  FINDINGS: There is a mildly impacted fracture of the distal radial metaphysis. There is a nondisplaced fracture through the ulnar styloid. The radiocarpal joint is preserved. The carpal bones are intact. The visualized portions of the metacarpals are normal.  IMPRESSION: The patient has sustained a nondisplaced impacted fracture of the distal radial metaphysis. An nondisplaced ulnar styloid fracture is present as well.   Electronically Signed   By: David  [place]   On: 02/23/2014 13:43   Dg Knee Complete 4 Views Left  02/23/2014   CLINICAL DATA:  Status post fall. Leading on the left side with left knee pain; history of left knee surgery years ago.  EXAM: LEFT KNEE - COMPLETE 4+ VIEW  COMPARISON:  None.  FINDINGS: The bones are adequately mineralized for age. There is mild beaking of the tibial spines. The medial and lateral joint compartments are reasonably well-maintained as is the patellofemoral joint compartment. There is no joint effusion. The proximal fibula is intact. There are 2 metallic screws  horizontally oriented through the tibial metaphysis.  IMPRESSION: There is no acute bony abnormality of the left knee.   Electronically Signed   By: David  [place]   On: 02/23/2014 13:45    EKG Interpretation None      MDM   Final diagnoses:  Distal radius fracture, left, closed, initial encounter  Fracture of ulnar styloid, left, closed, initial encounter  Traumatic closed displaced fracture of multiple ribs, left, initial encounter  Fall, initial encounter  Knee contusion, left, initial encounter   Patient presenting with left-sided wrist, rib and knee pain after fall. She is nontoxic appearing and in no apparent distress. No respiratory distress. Lungs clear. Neurovascularly intact. X-rays showing nondisplaced impacted fracture of the distal radial metaphysis and a nondisplaced ulnar styloid fracture along with minimally displaced fractures of the anterior aspect of the left fifth, sixth and seventh ribs without associated pneumothorax. Short arm splint applied for wrist fracture. Incentive spirometer given for rib fractures. Dr. Doy Mince discussed admission versus discharge with patient regarding multiple rib fractures and risk for developing pneumonia. Patient is requesting to go home. She appears well and has no respiratory compromise. She will followup with her PCP. She will also followup with orthopedics, she has seen Dr. Noemi Chapel in the past. Stable for d/c. Return precautions given. Patient states understanding of treatment care plan and is agreeable.  I personally performed the services described in this documentation, which was scribed in my presence. The recorded information has been reviewed and is accurate.    Illene Labrador, PA-C 02/23/14 1541

## 2014-02-23 NOTE — Discharge Instructions (Signed)
Take percocet for severe pain only. No driving or operating heavy machinery while taking percocet. This medication may cause drowsiness. Follow up with the orthopedist. It is important to use the incentive spirometer throughout the day to prevent pneumonia or collapsed lung.  Incentive Spirometer An incentive spirometer is a tool that can help keep your lungs clear and active. This tool measures how well you are filling your lungs with each breath. Taking long, deep breaths may help reverse or decrease the chance of developing breathing (pulmonary) problems (especially infection) following:  Surgery of the chest or abdomen.  Surgery if you have a history of smoking or a lung problem.  A long period of time when you are unable to move or be active. BEFORE THE PROCEDURE   If the spirometer includes an indicator to show your best effort, your nurse or respiratory therapist will set it to a desired goal.  If possible, sit up straight or lean slightly forward. Try not to slouch.  Hold the incentive spirometer in an upright position. INSTRUCTIONS FOR USE  1. Sit on the edge of your bed if possible, or sit up as far as you can in bed or on a chair. 2. Hold the incentive spirometer in an upright position. 3. Breathe out normally. 4. Place the mouthpiece in your mouth and seal your lips tightly around it. 5. Breathe in slowly and as deeply as possible, raising the piston or the ball toward the top of the column. 6. Hold your breath for 3-5 seconds or for as long as possible. Allow the piston or ball to fall to the bottom of the column. 7. Remove the mouthpiece from your mouth and breathe out normally. 8. Rest for a few seconds and repeat Steps 1 through 7 at least 10 times every 1-2 hours when you are awake. Take your time and take a few normal breaths between deep breaths. 9. The spirometer may include an indicator to show your best effort. Use the indicator as a goal to work toward during each  repetition. 10. After each set of 10 deep breaths, practice coughing to be sure your lungs are clear. If you have an incision (the cut made at the time of surgery), support your incision when coughing by placing a pillow or rolled-up towels firmly against it. Once you are able to get out of bed, walk around indoors and cough well. You may stop using the incentive spirometer when instructed by your caregiver.  RISKS AND COMPLICATIONS  Breathing too quickly may cause dizziness. At an extreme, this could cause you to pass out. Take your time so you do not get dizzy or light-headed.  If you are in pain, you may need to take or ask for pain medication before doing incentive spirometry. It is harder to take a deep breath if you are having pain. AFTER USE  Rest and breathe slowly and easily.  It can be helpful to keep a log of your progress. Your caregiver can provide you with a simple table to help with this. If you are using the spirometer at home, follow these instructions: [place] IF:   You are having difficultly using the spirometer.  You have trouble using the spirometer as often as instructed.  Your pain medication is not giving enough relief while using the spirometer.  You develop fever of 100.65F (38.1C) or higher. SEEK IMMEDIATE MEDICAL CARE IF:   You cough up bloody sputum that had not been present before.  You develop  fever of 102F (38.9C) or greater.  You develop worsening pain at or near the incision site. MAKE SURE YOU:   Understand these instructions.  Will watch your condition.  Will get help right away if you are not doing well or get worse. Document Released: 09/21/2006 Document Revised: 09/25/2013 Document Reviewed: 11/22/2006 [facility] Patient Information 2015 [place], [place]. This information is not intended to replace advice given to you by your health care provider. Make sure you discuss any questions you have with your health care  provider.   Forearm Fracture Your caregiver has diagnosed you as having a broken bone (fracture) of the forearm. This is the part of your arm between the elbow and your wrist. Your forearm is made up of two bones. These are the radius and ulna. A fracture is a break in one or both bones. A cast or splint is used to protect and keep your injured bone from moving. The cast or splint will be on generally for about 5 to 6 weeks, with individual variations. HOME CARE INSTRUCTIONS   Keep the injured part elevated while sitting or lying down. Keeping the injury above the level of your heart (the center of the chest). This will decrease swelling and pain.  Apply ice to the injury for 15-20 minutes, 03-04 times per day while awake, for 2 days. Put the ice in a plastic bag and place a thin towel between the bag of ice and your cast or splint.  If you have a plaster or fiberglass cast:  Do not try to scratch the skin under the cast using sharp or pointed objects.  Check the skin around the cast every day. You may put lotion on any red or sore areas.  Keep your cast dry and clean.  If you have a plaster splint:  Wear the splint as directed.  You may loosen the elastic around the splint if your fingers become numb, tingle, or turn cold or blue.  Do not put pressure on any part of your cast or splint. It may break. Rest your cast only on a pillow the first 24 hours until it is fully hardened.  Your cast or splint can be protected during bathing with a plastic bag. Do not lower the cast or splint into water.  Only take over-the-counter or prescription medicines for pain, discomfort, or fever as directed by your caregiver. SEEK IMMEDIATE MEDICAL CARE IF:   Your cast gets damaged or breaks.  You have more severe pain or swelling than you did before the cast.  Your skin or nails below the injury turn blue or gray, or feel cold or numb.  There is a bad smell or new stains and/or pus like  (purulent) drainage coming from under the cast. MAKE SURE YOU:   Understand these instructions.  Will watch your condition.  Will get help right away if you are not doing well or get worse. Document Released: 05/08/2000 Document Revised: 08/03/2011 Document Reviewed: 12/29/2007 [facility] Patient Information 2015 [place], [place]. This information is not intended to replace advice given to you by your health care provider. Make sure you discuss any questions you have with your health care provider.  Radial Fracture You have a broken bone (fracture) of the forearm. This is the part of your arm between the elbow and your wrist. Your forearm is made up of two bones. These are the radius and ulna. Your fracture is in the radial shaft. This is the bone in your forearm located on the  thumb side. A cast or splint is used to protect and keep your injured bone from moving. The cast or splint will be on generally for about 5 to 6 weeks, with individual variations. HOME CARE INSTRUCTIONS   Keep the injured part elevated while sitting or lying down. Keep the injury above the level of your heart (the center of the chest). This will decrease swelling and pain.  Apply ice to the injury for 15-20 minutes, 03-04 times per day while awake, for 2 days. Put the ice in a plastic bag and place a towel between the bag of ice and your cast or splint.  Move your fingers to avoid stiffness and minimize swelling.  If you have a plaster or fiberglass cast:  Do not try to scratch the skin under the cast using sharp or pointed objects.  Check the skin around the cast every day. You may put lotion on any red or sore areas.  Keep your cast dry and clean.  If you have a plaster splint:  Wear the splint as directed.  You may loosen the elastic around the splint if your fingers become numb, tingle, or turn cold or blue.  Do not put pressure on any part of your cast or splint. It may break. Rest your cast only on a  pillow for the first 24 hours until it is fully hardened.  Your cast or splint can be protected during bathing with a plastic bag. Do not lower the cast or splint into water.  Only take over-the-counter or prescription medicines for pain, discomfort, or fever as directed by your caregiver. SEEK IMMEDIATE MEDICAL CARE IF:   Your cast gets damaged or breaks.  You have more severe pain or swelling than you did before getting the cast.  You have severe pain when stretching your fingers.  There is a bad smell, new stains and/or pus-like (purulent) drainage coming from under the cast.  Your fingers or hand turn pale or blue and become cold or your loose feeling. Document Released: 10/22/2005 Document Revised: 08/03/2011 Document Reviewed: 01/18/2006 [facility] Patient Information 2015 [place], [place]. This information is not intended to replace advice given to you by your health care provider. Make sure you discuss any questions you have with your health care provider.  Rib Fracture A rib fracture is a break or crack in one of the bones of the ribs. The ribs are a group of long, curved bones that wrap around your chest and attach to your spine. They protect your lungs and other organs in the chest cavity. A broken or cracked rib is often painful, but most do not cause other problems. Most rib fractures heal on their own over time. However, rib fractures can be more serious if multiple ribs are broken or if broken ribs move out of place and push against other structures. CAUSES   A direct blow to the chest. For example, this could happen during contact sports, a car accident, or a fall against a hard object.  Repetitive movements with high force, such as pitching a baseball or having severe coughing spells. SYMPTOMS   Pain when you breathe in or cough.  Pain when someone presses on the injured area. DIAGNOSIS  Your caregiver will perform a physical exam. Various imaging tests may be ordered to  confirm the diagnosis and to look for related injuries. These tests may include a chest X-ray, computed tomography (CT), magnetic resonance imaging (MRI), or a bone scan. TREATMENT  Rib fractures usually heal  on their own in 1-3 months. The longer healing period is often associated with a continued cough or other aggravating activities. During the healing period, pain control is very important. Medication is usually given to control pain. Hospitalization or surgery may be needed for more severe injuries, such as those in which multiple ribs are broken or the ribs have moved out of place.  HOME CARE INSTRUCTIONS   Avoid strenuous activity and any activities or movements that cause pain. Be careful during activities and avoid bumping the injured rib.  Gradually increase activity as directed by your caregiver.  Only take over-the-counter or prescription medications as directed by your caregiver. Do not take other medications without asking your caregiver first.  Apply ice to the injured area for the first 1-2 days after you have been treated or as directed by your caregiver. Applying ice helps to reduce inflammation and pain.  Put ice in a plastic bag.  Place a towel between your skin and the bag.   Leave the ice on for 15-20 minutes at a time, every 2 hours while you are awake.  Perform deep breathing as directed by your caregiver. This will help prevent pneumonia, which is a common complication of a broken rib. Your caregiver may instruct you to:  Take deep breaths several times a day.  Try to cough several times a day, holding a pillow against the injured area.  Use a device called an incentive spirometer to practice deep breathing several times a day.  Drink enough fluids to keep your urine clear or pale yellow. This will help you avoid constipation.   Do not wear a rib belt or binder. These restrict breathing, which can lead to pneumonia.  SEEK IMMEDIATE MEDICAL CARE IF:   You  have a fever.   You have difficulty breathing or shortness of breath.   You develop a continual cough, or you cough up thick or bloody sputum.  You feel sick to your stomach (nausea), throw up (vomit), or have abdominal pain.   You have worsening pain not controlled with medications.  MAKE SURE YOU:  Understand these instructions.  Will watch your condition.  Will get help right away if you are not doing well or get worse. Document Released: 05/11/2005 Document Revised: 01/11/2013 Document Reviewed: 07/13/2012 [facility] Patient Information 2015 [place], [place]. This information is not intended to replace advice given to you by your health care provider. Make sure you discuss any questions you have with your health care provider.

## 2014-02-23 NOTE — ED Notes (Signed)
Pt fell earlier this when she tripped over her dogs. sts she is having left wrist, left knee and left rib pain. Denies hitting head.

## 2014-02-23 NOTE — Progress Notes (Signed)
Orthopedic Tech Progress Note Patient Details:  Alisha Delgado 1945-06-29 [id number]  Ortho Devices Type of Ortho Device: Ace wrap;Volar splint;Arm sling Ortho Device/Splint Location: lue Ortho Device/Splint Interventions: Application   Hao Dion 02/23/2014, 4:15 PM

## 2014-02-24 NOTE — ED Provider Notes (Signed)
Medical screening examination/treatment/procedure(s) were conducted as a shared visit with non-physician practitioner(s) and myself.  I personally evaluated the patient during the encounter.   EKG Interpretation None        Artis Delay, MD 02/24/14 [phone]

## 2014-02-27 DIAGNOSIS — S52502A Unspecified fracture of the lower end of left radius, initial encounter for closed fracture: Secondary | ICD-10-CM | POA: Diagnosis not present

## 2014-03-06 DIAGNOSIS — S52522A Torus fracture of lower end of left radius, initial encounter for closed fracture: Secondary | ICD-10-CM | POA: Diagnosis not present

## 2014-03-06 DIAGNOSIS — S83242A Other tear of medial meniscus, current injury, left knee, initial encounter: Secondary | ICD-10-CM | POA: Diagnosis not present

## 2014-03-13 DIAGNOSIS — R809 Proteinuria, unspecified: Secondary | ICD-10-CM | POA: Diagnosis not present

## 2014-03-13 DIAGNOSIS — I10 Essential (primary) hypertension: Secondary | ICD-10-CM | POA: Diagnosis not present

## 2014-03-13 DIAGNOSIS — E785 Hyperlipidemia, unspecified: Secondary | ICD-10-CM | POA: Diagnosis not present

## 2014-03-13 DIAGNOSIS — Z23 Encounter for immunization: Secondary | ICD-10-CM | POA: Diagnosis not present

## 2014-03-13 DIAGNOSIS — K760 Fatty (change of) liver, not elsewhere classified: Secondary | ICD-10-CM | POA: Diagnosis not present

## 2014-03-13 DIAGNOSIS — E1121 Type 2 diabetes mellitus with diabetic nephropathy: Secondary | ICD-10-CM | POA: Diagnosis not present

## 2014-03-13 DIAGNOSIS — Z1389 Encounter for screening for other disorder: Secondary | ICD-10-CM | POA: Diagnosis not present

## 2014-03-13 DIAGNOSIS — Z0001 Encounter for general adult medical examination with abnormal findings: Secondary | ICD-10-CM | POA: Diagnosis not present

## 2014-03-13 DIAGNOSIS — M8580 Other specified disorders of bone density and structure, unspecified site: Secondary | ICD-10-CM | POA: Diagnosis not present

## 2014-03-14 DIAGNOSIS — S52522D Torus fracture of lower end of left radius, subsequent encounter for fracture with routine healing: Secondary | ICD-10-CM | POA: Diagnosis not present

## 2014-03-14 DIAGNOSIS — S83242D Other tear of medial meniscus, current injury, left knee, subsequent encounter: Secondary | ICD-10-CM | POA: Diagnosis not present

## 2014-03-14 DIAGNOSIS — S52502D Unspecified fracture of the lower end of left radius, subsequent encounter for closed fracture with routine healing: Secondary | ICD-10-CM | POA: Diagnosis not present

## 2014-03-20 DIAGNOSIS — M1712 Unilateral primary osteoarthritis, left knee: Secondary | ICD-10-CM | POA: Diagnosis not present

## 2014-03-22 DIAGNOSIS — S52502D Unspecified fracture of the lower end of left radius, subsequent encounter for closed fracture with routine healing: Secondary | ICD-10-CM | POA: Diagnosis not present

## 2014-03-22 DIAGNOSIS — S52522D Torus fracture of lower end of left radius, subsequent encounter for fracture with routine healing: Secondary | ICD-10-CM | POA: Diagnosis not present

## 2014-03-22 DIAGNOSIS — S83242D Other tear of medial meniscus, current injury, left knee, subsequent encounter: Secondary | ICD-10-CM | POA: Diagnosis not present

## 2014-03-22 DIAGNOSIS — M1712 Unilateral primary osteoarthritis, left knee: Secondary | ICD-10-CM | POA: Diagnosis not present

## 2014-04-05 DIAGNOSIS — S52522D Torus fracture of lower end of left radius, subsequent encounter for fracture with routine healing: Secondary | ICD-10-CM | POA: Diagnosis not present

## 2014-04-23 DIAGNOSIS — R7989 Other specified abnormal findings of blood chemistry: Secondary | ICD-10-CM | POA: Diagnosis not present

## 2014-04-26 DIAGNOSIS — S52522D Torus fracture of lower end of left radius, subsequent encounter for fracture with routine healing: Secondary | ICD-10-CM | POA: Diagnosis not present

## 2014-05-25 DIAGNOSIS — R809 Proteinuria, unspecified: Secondary | ICD-10-CM

## 2014-05-25 HISTORY — PX: BREAST SURGERY: SHX581

## 2014-05-25 HISTORY — PX: CYSTECTOMY: SUR359

## 2014-05-25 HISTORY — DX: Proteinuria, unspecified: R80.9

## 2014-06-05 DIAGNOSIS — M899 Disorder of bone, unspecified: Secondary | ICD-10-CM | POA: Diagnosis not present

## 2014-06-05 DIAGNOSIS — M810 Age-related osteoporosis without current pathological fracture: Secondary | ICD-10-CM | POA: Diagnosis not present

## 2014-09-27 DIAGNOSIS — R413 Other amnesia: Secondary | ICD-10-CM | POA: Diagnosis not present

## 2014-09-27 DIAGNOSIS — E785 Hyperlipidemia, unspecified: Secondary | ICD-10-CM | POA: Diagnosis not present

## 2014-09-27 DIAGNOSIS — E1121 Type 2 diabetes mellitus with diabetic nephropathy: Secondary | ICD-10-CM | POA: Diagnosis not present

## 2014-09-27 DIAGNOSIS — R809 Proteinuria, unspecified: Secondary | ICD-10-CM | POA: Diagnosis not present

## 2014-09-27 DIAGNOSIS — I10 Essential (primary) hypertension: Secondary | ICD-10-CM | POA: Diagnosis not present

## 2014-10-16 DIAGNOSIS — H26491 Other secondary cataract, right eye: Secondary | ICD-10-CM | POA: Diagnosis not present

## 2014-10-23 DIAGNOSIS — Z961 Presence of intraocular lens: Secondary | ICD-10-CM | POA: Diagnosis not present

## 2014-10-23 DIAGNOSIS — H26491 Other secondary cataract, right eye: Secondary | ICD-10-CM | POA: Diagnosis not present

## 2014-10-23 DIAGNOSIS — H02839 Dermatochalasis of unspecified eye, unspecified eyelid: Secondary | ICD-10-CM | POA: Diagnosis not present

## 2014-10-23 DIAGNOSIS — E119 Type 2 diabetes mellitus without complications: Secondary | ICD-10-CM | POA: Diagnosis not present

## 2014-10-30 DIAGNOSIS — H26491 Other secondary cataract, right eye: Secondary | ICD-10-CM | POA: Diagnosis not present

## 2014-10-31 DIAGNOSIS — L738 Other specified follicular disorders: Secondary | ICD-10-CM | POA: Diagnosis not present

## 2014-10-31 DIAGNOSIS — N61 Inflammatory disorders of breast: Secondary | ICD-10-CM | POA: Diagnosis not present

## 2014-11-01 ENCOUNTER — Other Ambulatory Visit: Payer: Self-pay | Admitting: General Surgery

## 2014-11-01 ENCOUNTER — Other Ambulatory Visit (HOSPITAL_COMMUNITY)
Admission: RE | Admit: 2014-11-01 | Discharge: 2014-11-01 | Disposition: A | Discharge status: Home / Self Care | Payer: Medicare Other | Source: Ambulatory Visit | Attending: General Surgery | Admitting: General Surgery

## 2014-11-01 DIAGNOSIS — N6081 Other benign mammary dysplasias of right breast: Secondary | ICD-10-CM | POA: Diagnosis not present

## 2014-11-01 DIAGNOSIS — L723 Sebaceous cyst: Secondary | ICD-10-CM | POA: Diagnosis not present

## 2014-11-01 DIAGNOSIS — N63 Unspecified lump in breast: Secondary | ICD-10-CM | POA: Diagnosis not present

## 2014-11-01 DIAGNOSIS — N61 Inflammatory disorders of breast: Secondary | ICD-10-CM | POA: Diagnosis not present

## 2015-03-07 DIAGNOSIS — Z1231 Encounter for screening mammogram for malignant neoplasm of breast: Secondary | ICD-10-CM | POA: Diagnosis not present

## 2015-04-10 ENCOUNTER — Encounter: Payer: Self-pay | Admitting: Podiatry

## 2015-04-10 ENCOUNTER — Ambulatory Visit (INDEPENDENT_AMBULATORY_CARE_PROVIDER_SITE_OTHER): Payer: Medicare Other | Admitting: Podiatry

## 2015-04-10 ENCOUNTER — Ambulatory Visit: Payer: Medicare Other

## 2015-04-10 ENCOUNTER — Ambulatory Visit (INDEPENDENT_AMBULATORY_CARE_PROVIDER_SITE_OTHER): Payer: Medicare Other

## 2015-04-10 VITALS — BP 148/87 | HR 77 | Resp 12

## 2015-04-10 DIAGNOSIS — M7752 Other enthesopathy of left foot: Secondary | ICD-10-CM

## 2015-04-10 DIAGNOSIS — M79672 Pain in left foot: Secondary | ICD-10-CM | POA: Diagnosis not present

## 2015-04-10 DIAGNOSIS — Z1389 Encounter for screening for other disorder: Secondary | ICD-10-CM | POA: Diagnosis not present

## 2015-04-10 DIAGNOSIS — M779 Enthesopathy, unspecified: Secondary | ICD-10-CM

## 2015-04-10 DIAGNOSIS — I10 Essential (primary) hypertension: Secondary | ICD-10-CM | POA: Diagnosis not present

## 2015-04-10 DIAGNOSIS — R52 Pain, unspecified: Secondary | ICD-10-CM | POA: Diagnosis not present

## 2015-04-10 DIAGNOSIS — Z0000 Encounter for general adult medical examination without abnormal findings: Secondary | ICD-10-CM | POA: Diagnosis not present

## 2015-04-10 DIAGNOSIS — R609 Edema, unspecified: Secondary | ICD-10-CM | POA: Diagnosis not present

## 2015-04-10 DIAGNOSIS — E785 Hyperlipidemia, unspecified: Secondary | ICD-10-CM | POA: Diagnosis not present

## 2015-04-10 DIAGNOSIS — Z23 Encounter for immunization: Secondary | ICD-10-CM | POA: Diagnosis not present

## 2015-04-10 DIAGNOSIS — E1121 Type 2 diabetes mellitus with diabetic nephropathy: Secondary | ICD-10-CM | POA: Diagnosis not present

## 2015-04-10 DIAGNOSIS — M778 Other enthesopathies, not elsewhere classified: Secondary | ICD-10-CM

## 2015-04-10 DIAGNOSIS — M810 Age-related osteoporosis without current pathological fracture: Secondary | ICD-10-CM | POA: Diagnosis not present

## 2015-04-10 NOTE — Patient Instructions (Signed)
Today your diabetic foot screen revealed good circulation and adequate feeling. We'll continue to try to evaluate that persistent pain and swelling in the left foot. The x-rays taken today demonstrate no active bone activity in the foot or ankle. I am referring you to the lab for a baseline arthritic panel to rule out any inflammatory arthritis. I will review the lab results with you next week.  Diabetes and Foot Care Diabetes may cause you to have problems because of poor blood supply (circulation) to your feet and legs. This may cause the skin on your feet to become thinner, break easier, and heal more slowly. Your skin may become dry, and the skin may peel and crack. You may also have nerve damage in your legs and feet causing decreased feeling in them. You may not notice minor injuries to your feet that could lead to infections or more serious problems. Taking care of your feet is one of the most important things you can do for yourself.  HOME CARE INSTRUCTIONS  Wear shoes at all times, even in the house. Do not go barefoot. Bare feet are easily injured.  Check your feet daily for blisters, cuts, and redness. If you cannot see the bottom of your feet, use a mirror or ask someone for help.  Wash your feet with warm water (do not use hot water) and mild soap. Then pat your feet and the areas between your toes until they are completely dry. Do not soak your feet as this can dry your skin.  Apply a moisturizing lotion or petroleum jelly (that does not contain alcohol and is unscented) to the skin on your feet and to dry, brittle toenails. Do not apply lotion between your toes.  Trim your toenails straight across. Do not dig under them or around the cuticle. File the edges of your nails with an emery board or nail file.  Do not cut corns or calluses or try to remove them with medicine.  Wear clean socks or stockings every day. Make sure they are not too tight. Do not wear knee-high stockings since  they may decrease blood flow to your legs.  Wear shoes that fit properly and have enough cushioning. To break in new shoes, wear them for just a few hours a day. This prevents you from injuring your feet. Always look in your shoes before you put them on to be sure there are no objects inside.  Do not cross your legs. This may decrease the blood flow to your feet.  If you find a minor scrape, cut, or break in the skin on your feet, keep it and the skin around it clean and dry. These areas may be cleansed with mild soap and water. Do not cleanse the area with peroxide, alcohol, or iodine.  When you remove an adhesive bandage, be sure not to damage the skin around it.  If you have a wound, look at it several times a day to make sure it is healing.  Do not use heating pads or hot water bottles. They may burn your skin. If you have lost feeling in your feet or legs, you may not know it is happening until it is too late.  Make sure your health care provider performs a complete foot exam at least annually or more often if you have foot problems. Report any cuts, sores, or bruises to your health care provider immediately. SEEK MEDICAL CARE IF:   You have an injury that is not healing.  You have cuts or breaks in the skin.  You have an ingrown nail.  You notice redness on your legs or feet.  You feel burning or tingling in your legs or feet.  You have pain or cramps in your legs and feet.  Your legs or feet are numb.  Your feet always feel cold. SEEK IMMEDIATE MEDICAL CARE IF:   There is increasing redness, swelling, or pain in or around a wound.  There is a red line that goes up your leg.  Pus is coming from a wound.  You develop a fever or as directed by your health care provider.  You notice a bad smell coming from an ulcer or wound.   This information is not intended to replace advice given to you by your health care provider. Make sure you discuss any questions you have with  your health care provider.   Document Released: 05/08/2000 Document Revised: 01/11/2013 Document Reviewed: 10/18/2012 Elsevier Interactive Patient Education [employer].

## 2015-04-10 NOTE — Progress Notes (Signed)
   Subjective:    Patient ID: Alisha Delgado, female    DOB: 19-Feb-1946, 69 y.o.   MRN: [id number]  HPI    This patient presents today complaining of approximately three-month history of a persistent painful swollen left foot and ankle area that is gradually increasing in discomfort over this time. The left ankle and foot is uncomfortable with weightbearing and passive motion and relieved with rest. The swelling is persistent on and off weightbearing and does not decrease in the a.m. or 10 to increase in the p.m. Patient does not recall any direct injury or change of inactivity because of left foot and ankle pain. She denies any other history of painful swollen joints interbody this patient is a type II diabetic for 15 years and describes well-controlled blood glucose levels.  Patient denies any history of foot ulcerations, claudication or amputation  Review of Systems  Constitutional: Positive for activity change.  Musculoskeletal: Positive for gait problem.       Objective:   Physical Exam  Pleasant orientated 3 Vascular: Nonpitting edema left foot ankle DP and PT pulses 2/4 bilaterally Capillary reflex immediate bilaterally  Neurological: Sensation to 10 g monofilament wire intact 5/5 bilaterally Vibratory sensation intact bilaterally Ankle reflex equal and reactive bilaterally  Dermatological: Texture and turgor within normal limits bilaterally No open skin lesions noted bilaterally There is no increase in warmth in the left or right foot  Musculoskeletal: Palpable tenderness dorsal lateral aspect of the left foot without any palpable lesions Tenderness on passive range of motion on inversion and eversion of the midfoot, left Mild tenderness on range of motion left ankle without crepitus or restriction of ankle joint   X-ray examination weightbearing left ankle dated 04/10/2015  Intact bony structure without fracture or dislocation Bone density appears adequate Ankle  joint appears adequate  Radiographic impression: No acute bony abnormality noted in left ankle  X-ray examination weightbearing left foot dated 04/10/2015  Intact bony structure without any fracture and/or dislocation Bone density appears adequate Joint space appear adequate Inferior calcaneal spur  Radiographic impression: No acute bony abnormality noted left foot       Assessment & Plan:   Assessment: Edema of unknown origin left foot Rule out inflammatory origin No x-ray evidence of acute bony abnormality noted, left foot or ankle  Plan: Refer patient to the lab for CBC, sedimentation rate, serum uric acid, and a, rheumatoid arthritis, C-reactive protein Return patient in 7 days for review of lab If lab is within normal limits will place patient in Cam Walker boot on the left foot/ankle

## 2015-04-11 LAB — CBC WITH DIFFERENTIAL/PLATELET
Basophils Absolute: 0 10*3/uL (ref 0.0–0.1)
Basophils Relative: 1 % (ref 0–1)
Eosinophils Absolute: 0.1 10*3/uL (ref 0.0–0.7)
Eosinophils Relative: 2 % (ref 0–5)
HCT: 37.4 % (ref 36.0–46.0)
Hemoglobin: 12.2 g/dL (ref 12.0–15.0)
Lymphocytes Relative: 29 % (ref 12–46)
Lymphs Abs: 1.4 10*3/uL (ref 0.7–4.0)
MCH: 29.6 pg (ref 26.0–34.0)
MCHC: 32.6 g/dL (ref 30.0–36.0)
MCV: 90.8 fL (ref 78.0–100.0)
MPV: 9.9 fL (ref 8.6–12.4)
Monocytes Absolute: 0.3 10*3/uL (ref 0.1–1.0)
Monocytes Relative: 6 % (ref 3–12)
Neutro Abs: 2.9 10*3/uL (ref 1.7–7.7)
Neutrophils Relative %: 62 % (ref 43–77)
Platelets: 345 10*3/uL (ref 150–400)
RBC: 4.12 MIL/uL (ref 3.87–5.11)
RDW: 13.6 % (ref 11.5–15.5)
WBC: 4.7 10*3/uL (ref 4.0–10.5)

## 2015-04-11 LAB — URIC ACID: Uric Acid, Serum: 6.9 mg/dL (ref 2.4–7.0)

## 2015-04-11 LAB — C-REACTIVE PROTEIN: CRP: 1.1 mg/dL — ABNORMAL HIGH (ref <0.60)

## 2015-04-11 LAB — RHEUMATOID FACTOR: Rhuematoid fact SerPl-aCnc: <10 IU/mL (ref <=14)

## 2015-04-12 LAB — ANA: Anti Nuclear Antibody(ANA): NEGATIVE

## 2015-04-12 LAB — SEDIMENTATION RATE: Sed Rate: 11 mm/hr (ref 0–30)

## 2015-04-17 ENCOUNTER — Encounter: Payer: Self-pay | Admitting: Podiatry

## 2015-04-17 ENCOUNTER — Ambulatory Visit (INDEPENDENT_AMBULATORY_CARE_PROVIDER_SITE_OTHER): Payer: Medicare Other | Admitting: Podiatry

## 2015-04-17 VITALS — BP 127/78 | HR 82 | Resp 12

## 2015-04-17 DIAGNOSIS — S96912S Strain of unspecified muscle and tendon at ankle and foot level, left foot, sequela: Secondary | ICD-10-CM | POA: Diagnosis not present

## 2015-04-17 DIAGNOSIS — R609 Edema, unspecified: Secondary | ICD-10-CM | POA: Diagnosis not present

## 2015-04-17 NOTE — Progress Notes (Signed)
   Subjective:    Patient ID: Alisha Delgado, female    DOB: 1945/09/04, 69 y.o.   MRN: [id number]  HPI This patient presents today for follow-up visit of 04/10/2015. At initial presentation is patient had a three-month history of a persistent painful swollen left foot and ankle area dated and gradually increase in discomfort over time. Patient has no history of injury or increase activity. She has no other history of joint pain. She is a type II diabetic 15 years without any history of foot ulcerations or claudication. On initial visit a CBC and arthritis screening panel was ordered. Initial x-rays on the first visit demonstrated no acute bony abnormality in the left foot   Review of Systems  Cardiovascular: Positive for leg swelling.  Musculoskeletal: Positive for joint swelling.       Objective:   Physical Exam  Orientated 3 Vascular: Nonpitting edema left foot ankle DP and PT pulses 2/4 bilaterally Reflex immediate bilaterally  Neurological: Sensation to 10 g monofilament wire intact 5/5 bilaterally Vibratory sensation intact bilaterally Ankle reflex equal and reactive bilaterally  Dermatological: Nonpitting peripheral edema dorsal left foot ankle No skin lesions bilaterally  Musculoskeletal: Mild palpable tenderness dorsal lateral left foot without any palpable lesions  Results of arthritic panel dated 04/10/2015 sedimentation rate, anti-nuclear antibody, uric acid within normal limits C-reactive protein slightly elevated 1.1 CBC with differential within normal limits       Assessment & Plan:   Assessment: No obvious inflammatory origin of left foot Uric acid was high end of normal range, however, patient denies any gouty episodes Mildly elevated C-reactive protein may not correlate clinically Treat as a sprain chronic foot or possible chronic bone stress reaction left foot/ankle.  Plan: Today review the results of the examination and lab. I am recommending the  patient wear a Cam Walker boot on her left foot ankle 6 weeks. I advised to wear this on a consistent basis throughout the day and remove at night. Also, I advised her to remove the boot in a seated position several times a day and dorsi and plantarflex her foot to encourage circulation in extremity  Reappoint 6 weeks

## 2015-04-17 NOTE — Patient Instructions (Addendum)
Your lab for inflammation demonstrated no active arthritic inflammation Wear treating you as if you have this sprained foot with swelling Wear knee length compression hose apply in that area morning and remove in the evening Wear boot on left foot and leg apply in morning remove it bedtime Several times a day in a seated position remove boot and move foot up and down for several minutes to pump circulation in your calf muscle Return 6 weeks for reevaluation

## 2015-05-29 ENCOUNTER — Encounter: Payer: Self-pay | Admitting: Podiatry

## 2015-05-29 ENCOUNTER — Ambulatory Visit (INDEPENDENT_AMBULATORY_CARE_PROVIDER_SITE_OTHER): Payer: Medicare Other | Admitting: Podiatry

## 2015-05-29 VITALS — BP 138/76 | HR 70 | Temp 98.6°F | Resp 12

## 2015-05-29 DIAGNOSIS — S93402D Sprain of unspecified ligament of left ankle, subsequent encounter: Secondary | ICD-10-CM

## 2015-05-29 NOTE — Progress Notes (Signed)
   Subjective:    Patient ID: Alisha Delgado, female    DOB: Oct 18, 1945, 70 y.o.   MRN: [id number]  HPI  This patient presents today for follow-up visit of 04/10/2015. At initial presentation is patient had a three-month history of a persistent painful swollen left foot and ankle area dated and gradually increase in discomfort over time. Patient has no history of injury or increase activity. She has no other history of joint pain. She is a type II diabetic 15 years without any history of foot ulcerations or claudication. On initial visit a CBC and arthritis screening panel was ordered. Initial x-rays on the first visit demonstrated no acute bony abnormality in the left foot Uric acid was high end of normal range, however, patient denies any gouty episodes Mildly elevated C-reactive protein may not correlate clinically Treat as a sprain chronic foot or possible chronic bone stress reaction left foot/ankle. Today (04/16/2015) review the results of the examination and lab. I am recommending the patient wear a Cam Walker boot on her left foot ankle 6 weeks. I advised to wear this on a consistent basis throughout the day and remove at night. Also, I advised her to remove the boot in a seated position several times a day and dorsi and plantarflex her foot to encourage circulation in extremity this patient wore the Cam Walker boot and ongoing continuous basis as described above 6 weeks and describes significant reduction pain and swelling in the foot. Patient does recall a history of ankle injury some 3 years ago and does have an existing ankle like stabilizer which she had for this injury     Review of Systems  Cardiovascular: Positive for leg swelling.  Musculoskeletal: Positive for joint swelling.       Objective:   Physical Exam  Orientated 3  Vascular: No peripheral edema noted in the foot ankle including the left foot and ankle DP and PT pulses 2/4 bilaterally Capillary reflex immediate  bilaterally  Neurological: Sensation to 10 g monofilament wire intact 5/5 bilaterally Vibratory sensation intact bilaterally Ankle reflex equal and reactive bilaterally  Dermatological: No skin lesions noted bilaterally  Musculoskeletal: No restriction of ankle, subtalar, midtarsal joints bilaterally There is no crepitus or pain on ankle motion left Palpable tenderness over the left anterior talofibular ligament and left calcaneal fibular ligament    Assessment & Plan:   Assessment: Satisfactory neurovascular status Resolved edema left foot Chronic low-grade ankle sprain of anterior talofibular and calcaneofibular ligament, left ankle  Plan: Today review the results examination with patient today and made aware that I thought that she does have a chronic ankle sprain as demonstrated with palpable tenderness in the anterior talofibular ligament and calcaneofibular ligaments. I recommended that if she anticipates prolonged standing walking to wear existing ankle stabilizer on the left foot  Patient is is discharged from active follow-up at this time

## 2015-05-29 NOTE — Patient Instructions (Signed)
Today your examination demonstrated resolve swelling in the left ankle. There is remote history of ankle injury several years ago. There is tenderness over 2/ 3 ligaments in the outside of the left ankle which probably never completely healed. I recommend that when you do prolonged standing or walking you where your existing ankle stabilizer on your left ankle. Otherwise I'm discharging her from active follow-up

## 2016-03-09 DIAGNOSIS — K5730 Diverticulosis of large intestine without perforation or abscess without bleeding: Secondary | ICD-10-CM | POA: Diagnosis not present

## 2016-03-09 DIAGNOSIS — E669 Obesity, unspecified: Secondary | ICD-10-CM | POA: Diagnosis not present

## 2016-03-09 DIAGNOSIS — D509 Iron deficiency anemia, unspecified: Secondary | ICD-10-CM | POA: Diagnosis not present

## 2016-03-09 DIAGNOSIS — Z23 Encounter for immunization: Secondary | ICD-10-CM | POA: Diagnosis not present

## 2016-03-09 DIAGNOSIS — E1121 Type 2 diabetes mellitus with diabetic nephropathy: Secondary | ICD-10-CM | POA: Diagnosis not present

## 2016-03-09 DIAGNOSIS — M810 Age-related osteoporosis without current pathological fracture: Secondary | ICD-10-CM | POA: Diagnosis not present

## 2016-03-09 DIAGNOSIS — R634 Abnormal weight loss: Secondary | ICD-10-CM | POA: Diagnosis not present

## 2016-03-09 DIAGNOSIS — Z6832 Body mass index (BMI) 32.0-32.9, adult: Secondary | ICD-10-CM | POA: Diagnosis not present

## 2016-03-09 DIAGNOSIS — I10 Essential (primary) hypertension: Secondary | ICD-10-CM | POA: Diagnosis not present

## 2016-03-09 DIAGNOSIS — Z7984 Long term (current) use of oral hypoglycemic drugs: Secondary | ICD-10-CM | POA: Diagnosis not present

## 2016-03-13 DIAGNOSIS — Z1231 Encounter for screening mammogram for malignant neoplasm of breast: Secondary | ICD-10-CM | POA: Diagnosis not present

## 2016-03-13 DIAGNOSIS — M8589 Other specified disorders of bone density and structure, multiple sites: Secondary | ICD-10-CM | POA: Diagnosis not present

## 2016-04-13 DIAGNOSIS — Z0000 Encounter for general adult medical examination without abnormal findings: Secondary | ICD-10-CM | POA: Diagnosis not present

## 2016-04-13 DIAGNOSIS — E785 Hyperlipidemia, unspecified: Secondary | ICD-10-CM | POA: Diagnosis not present

## 2016-04-13 DIAGNOSIS — Z1231 Encounter for screening mammogram for malignant neoplasm of breast: Secondary | ICD-10-CM | POA: Diagnosis not present

## 2016-04-13 DIAGNOSIS — M8588 Other specified disorders of bone density and structure, other site: Secondary | ICD-10-CM | POA: Diagnosis not present

## 2016-04-13 DIAGNOSIS — Z1389 Encounter for screening for other disorder: Secondary | ICD-10-CM | POA: Diagnosis not present

## 2016-04-13 DIAGNOSIS — Z7984 Long term (current) use of oral hypoglycemic drugs: Secondary | ICD-10-CM | POA: Diagnosis not present

## 2016-04-13 DIAGNOSIS — Z1211 Encounter for screening for malignant neoplasm of colon: Secondary | ICD-10-CM | POA: Diagnosis not present

## 2016-04-13 DIAGNOSIS — R748 Abnormal levels of other serum enzymes: Secondary | ICD-10-CM | POA: Diagnosis not present

## 2016-04-13 DIAGNOSIS — E119 Type 2 diabetes mellitus without complications: Secondary | ICD-10-CM | POA: Diagnosis not present

## 2016-04-13 DIAGNOSIS — D509 Iron deficiency anemia, unspecified: Secondary | ICD-10-CM | POA: Diagnosis not present

## 2016-04-13 DIAGNOSIS — E1121 Type 2 diabetes mellitus with diabetic nephropathy: Secondary | ICD-10-CM | POA: Diagnosis not present

## 2016-04-13 DIAGNOSIS — I10 Essential (primary) hypertension: Secondary | ICD-10-CM | POA: Diagnosis not present

## 2016-04-21 DIAGNOSIS — E119 Type 2 diabetes mellitus without complications: Secondary | ICD-10-CM | POA: Diagnosis not present

## 2016-07-21 DIAGNOSIS — Z6831 Body mass index (BMI) 31.0-31.9, adult: Secondary | ICD-10-CM | POA: Diagnosis not present

## 2016-07-21 DIAGNOSIS — R748 Abnormal levels of other serum enzymes: Secondary | ICD-10-CM | POA: Diagnosis not present

## 2016-07-21 DIAGNOSIS — I10 Essential (primary) hypertension: Secondary | ICD-10-CM | POA: Diagnosis not present

## 2016-07-21 DIAGNOSIS — M810 Age-related osteoporosis without current pathological fracture: Secondary | ICD-10-CM | POA: Diagnosis not present

## 2016-07-21 DIAGNOSIS — E669 Obesity, unspecified: Secondary | ICD-10-CM | POA: Diagnosis not present

## 2016-07-21 DIAGNOSIS — E785 Hyperlipidemia, unspecified: Secondary | ICD-10-CM | POA: Diagnosis not present

## 2016-07-21 DIAGNOSIS — Z7984 Long term (current) use of oral hypoglycemic drugs: Secondary | ICD-10-CM | POA: Diagnosis not present

## 2016-07-21 DIAGNOSIS — E119 Type 2 diabetes mellitus without complications: Secondary | ICD-10-CM | POA: Diagnosis not present

## 2016-08-05 DIAGNOSIS — J019 Acute sinusitis, unspecified: Secondary | ICD-10-CM | POA: Diagnosis not present

## 2016-08-05 DIAGNOSIS — E119 Type 2 diabetes mellitus without complications: Secondary | ICD-10-CM | POA: Diagnosis not present

## 2016-08-05 DIAGNOSIS — B9689 Other specified bacterial agents as the cause of diseases classified elsewhere: Secondary | ICD-10-CM | POA: Diagnosis not present

## 2016-08-05 DIAGNOSIS — Z7984 Long term (current) use of oral hypoglycemic drugs: Secondary | ICD-10-CM | POA: Diagnosis not present

## 2016-08-05 DIAGNOSIS — E785 Hyperlipidemia, unspecified: Secondary | ICD-10-CM | POA: Diagnosis not present

## 2016-10-29 DIAGNOSIS — E119 Type 2 diabetes mellitus without complications: Secondary | ICD-10-CM | POA: Diagnosis not present

## 2016-10-29 DIAGNOSIS — Z7984 Long term (current) use of oral hypoglycemic drugs: Secondary | ICD-10-CM | POA: Diagnosis not present

## 2016-10-29 DIAGNOSIS — I10 Essential (primary) hypertension: Secondary | ICD-10-CM | POA: Diagnosis not present

## 2016-10-29 DIAGNOSIS — E785 Hyperlipidemia, unspecified: Secondary | ICD-10-CM | POA: Diagnosis not present

## 2017-01-27 DIAGNOSIS — E785 Hyperlipidemia, unspecified: Secondary | ICD-10-CM | POA: Diagnosis not present

## 2017-01-27 DIAGNOSIS — E119 Type 2 diabetes mellitus without complications: Secondary | ICD-10-CM | POA: Diagnosis not present

## 2017-01-27 DIAGNOSIS — I10 Essential (primary) hypertension: Secondary | ICD-10-CM | POA: Diagnosis not present

## 2017-04-20 DIAGNOSIS — Z0000 Encounter for general adult medical examination without abnormal findings: Secondary | ICD-10-CM | POA: Diagnosis not present

## 2017-04-20 DIAGNOSIS — E119 Type 2 diabetes mellitus without complications: Secondary | ICD-10-CM | POA: Diagnosis not present

## 2017-04-20 DIAGNOSIS — E785 Hyperlipidemia, unspecified: Secondary | ICD-10-CM | POA: Diagnosis not present

## 2017-04-20 DIAGNOSIS — K5730 Diverticulosis of large intestine without perforation or abscess without bleeding: Secondary | ICD-10-CM | POA: Diagnosis not present

## 2017-04-20 DIAGNOSIS — Z23 Encounter for immunization: Secondary | ICD-10-CM | POA: Diagnosis not present

## 2017-04-20 DIAGNOSIS — Z1389 Encounter for screening for other disorder: Secondary | ICD-10-CM | POA: Diagnosis not present

## 2017-04-20 DIAGNOSIS — I10 Essential (primary) hypertension: Secondary | ICD-10-CM | POA: Diagnosis not present

## 2017-04-20 DIAGNOSIS — D509 Iron deficiency anemia, unspecified: Secondary | ICD-10-CM | POA: Diagnosis not present

## 2017-04-20 DIAGNOSIS — M810 Age-related osteoporosis without current pathological fracture: Secondary | ICD-10-CM | POA: Diagnosis not present

## 2017-04-20 DIAGNOSIS — Z6832 Body mass index (BMI) 32.0-32.9, adult: Secondary | ICD-10-CM | POA: Diagnosis not present

## 2017-05-25 DIAGNOSIS — M8580 Other specified disorders of bone density and structure, unspecified site: Secondary | ICD-10-CM

## 2017-05-25 DIAGNOSIS — M810 Age-related osteoporosis without current pathological fracture: Secondary | ICD-10-CM

## 2017-05-25 HISTORY — DX: Other specified disorders of bone density and structure, unspecified site: M85.80

## 2017-05-25 HISTORY — DX: Age-related osteoporosis without current pathological fracture: M81.0

## 2017-11-02 DIAGNOSIS — H2513 Age-related nuclear cataract, bilateral: Secondary | ICD-10-CM | POA: Diagnosis not present

## 2017-12-28 ENCOUNTER — Ambulatory Visit
Admission: RE | Admit: 2017-12-28 | Discharge: 2017-12-28 | Disposition: A | Discharge status: Home / Self Care | Payer: Medicare Other | Source: Ambulatory Visit | Attending: Internal Medicine | Admitting: Internal Medicine

## 2017-12-28 ENCOUNTER — Other Ambulatory Visit: Payer: Self-pay | Admitting: Internal Medicine

## 2017-12-28 DIAGNOSIS — Z1159 Encounter for screening for other viral diseases: Secondary | ICD-10-CM | POA: Diagnosis not present

## 2017-12-28 DIAGNOSIS — E1169 Type 2 diabetes mellitus with other specified complication: Secondary | ICD-10-CM | POA: Diagnosis not present

## 2017-12-28 DIAGNOSIS — M25551 Pain in right hip: Secondary | ICD-10-CM | POA: Diagnosis not present

## 2017-12-28 DIAGNOSIS — M1612 Unilateral primary osteoarthritis, left hip: Secondary | ICD-10-CM

## 2017-12-28 DIAGNOSIS — I10 Essential (primary) hypertension: Secondary | ICD-10-CM | POA: Diagnosis not present

## 2018-04-26 DIAGNOSIS — Z23 Encounter for immunization: Secondary | ICD-10-CM | POA: Diagnosis not present

## 2018-04-26 DIAGNOSIS — E1169 Type 2 diabetes mellitus with other specified complication: Secondary | ICD-10-CM | POA: Diagnosis not present

## 2018-04-26 DIAGNOSIS — I10 Essential (primary) hypertension: Secondary | ICD-10-CM | POA: Diagnosis not present

## 2018-04-26 DIAGNOSIS — M1610 Unilateral primary osteoarthritis, unspecified hip: Secondary | ICD-10-CM | POA: Diagnosis not present

## 2018-04-26 DIAGNOSIS — Z1389 Encounter for screening for other disorder: Secondary | ICD-10-CM | POA: Diagnosis not present

## 2018-04-26 DIAGNOSIS — Z7189 Other specified counseling: Secondary | ICD-10-CM | POA: Diagnosis not present

## 2018-04-26 DIAGNOSIS — M8588 Other specified disorders of bone density and structure, other site: Secondary | ICD-10-CM | POA: Diagnosis not present

## 2018-04-26 DIAGNOSIS — Z0000 Encounter for general adult medical examination without abnormal findings: Secondary | ICD-10-CM | POA: Diagnosis not present

## 2018-10-26 DIAGNOSIS — M8588 Other specified disorders of bone density and structure, other site: Secondary | ICD-10-CM | POA: Diagnosis not present

## 2018-10-26 DIAGNOSIS — Z1239 Encounter for other screening for malignant neoplasm of breast: Secondary | ICD-10-CM | POA: Diagnosis not present

## 2018-10-26 DIAGNOSIS — I10 Essential (primary) hypertension: Secondary | ICD-10-CM | POA: Diagnosis not present

## 2018-10-26 DIAGNOSIS — E1169 Type 2 diabetes mellitus with other specified complication: Secondary | ICD-10-CM | POA: Diagnosis not present

## 2018-11-07 ENCOUNTER — Other Ambulatory Visit: Payer: Self-pay | Admitting: Internal Medicine

## 2018-11-07 DIAGNOSIS — Z1231 Encounter for screening mammogram for malignant neoplasm of breast: Secondary | ICD-10-CM

## 2018-11-07 DIAGNOSIS — M810 Age-related osteoporosis without current pathological fracture: Secondary | ICD-10-CM

## 2019-01-06 DIAGNOSIS — M8589 Other specified disorders of bone density and structure, multiple sites: Secondary | ICD-10-CM | POA: Diagnosis not present

## 2019-01-06 DIAGNOSIS — Z90710 Acquired absence of both cervix and uterus: Secondary | ICD-10-CM | POA: Diagnosis not present

## 2019-01-06 DIAGNOSIS — R29890 Loss of height: Secondary | ICD-10-CM | POA: Diagnosis not present

## 2019-01-06 DIAGNOSIS — Z1231 Encounter for screening mammogram for malignant neoplasm of breast: Secondary | ICD-10-CM | POA: Diagnosis not present

## 2019-01-06 DIAGNOSIS — Z803 Family history of malignant neoplasm of breast: Secondary | ICD-10-CM | POA: Diagnosis not present

## 2019-01-06 DIAGNOSIS — Z8262 Family history of osteoporosis: Secondary | ICD-10-CM | POA: Diagnosis not present

## 2019-06-19 DIAGNOSIS — Z1389 Encounter for screening for other disorder: Secondary | ICD-10-CM | POA: Diagnosis not present

## 2019-06-19 DIAGNOSIS — Z0000 Encounter for general adult medical examination without abnormal findings: Secondary | ICD-10-CM | POA: Diagnosis not present

## 2019-06-19 DIAGNOSIS — Z23 Encounter for immunization: Secondary | ICD-10-CM | POA: Diagnosis not present

## 2019-06-19 DIAGNOSIS — I10 Essential (primary) hypertension: Secondary | ICD-10-CM | POA: Diagnosis not present

## 2019-06-19 DIAGNOSIS — E1169 Type 2 diabetes mellitus with other specified complication: Secondary | ICD-10-CM | POA: Diagnosis not present

## 2019-06-19 DIAGNOSIS — E785 Hyperlipidemia, unspecified: Secondary | ICD-10-CM | POA: Diagnosis not present

## 2019-06-19 DIAGNOSIS — M8588 Other specified disorders of bone density and structure, other site: Secondary | ICD-10-CM | POA: Diagnosis not present

## 2019-06-22 ENCOUNTER — Ambulatory Visit: Payer: Self-pay

## 2019-06-27 ENCOUNTER — Ambulatory Visit: Payer: Self-pay

## 2019-07-01 ENCOUNTER — Ambulatory Visit: Payer: Medicare Other | Attending: Internal Medicine

## 2019-07-01 DIAGNOSIS — Z23 Encounter for immunization: Secondary | ICD-10-CM | POA: Insufficient documentation

## 2019-07-01 NOTE — Progress Notes (Signed)
   Covid-19 Vaccination Clinic  Name:  Alisha Delgado    MRN: [id number] DOB: July 12, 1945  07/01/2019  Alisha Delgado was observed post Covid-19 immunization for 15 minutes without incidence. She was provided with Vaccine Information Sheet and instruction to access the V-Safe system.   Alisha Delgado was instructed to call 911 with any severe reactions post vaccine: Marland Kitchen Difficulty breathing  . Swelling of your face and throat  . A fast heartbeat  . A bad rash all over your body  . Dizziness and weakness    Immunizations Administered    Name Date Dose VIS Date Route   Pfizer COVID-19 Vaccine 07/01/2019  8:33 AM 0.3 mL 05/05/2019 Intramuscular   Manufacturer: [place]   Lot: [id number]   [place]: [id number]

## 2019-07-25 ENCOUNTER — Ambulatory Visit: Payer: Medicare Other | Attending: Internal Medicine

## 2019-07-25 ENCOUNTER — Ambulatory Visit: Payer: Medicare Other

## 2019-07-25 DIAGNOSIS — Z23 Encounter for immunization: Secondary | ICD-10-CM | POA: Insufficient documentation

## 2019-07-25 NOTE — Progress Notes (Signed)
   Covid-19 Vaccination Clinic  Name:  Alisha Delgado    MRN: [id number] DOB: 1946/01/16  07/25/2019  Alisha Delgado was observed post Covid-19 immunization for 15 minutes without incident. She was provided with Vaccine Information Sheet and instruction to access the V-Safe system.   Alisha Delgado was instructed to call 911 with any severe reactions post vaccine: Marland Kitchen Difficulty breathing  . Swelling of face and throat  . A fast heartbeat  . A bad rash all over body  . Dizziness and weakness   Immunizations Administered    Name Date Dose VIS Date Route   Pfizer COVID-19 Vaccine 07/25/2019 11:46 AM 0.3 mL 05/05/2019 Intramuscular   Manufacturer: [place]   Lot: [id number]   [place]: [id number]

## 2019-09-22 DIAGNOSIS — E785 Hyperlipidemia, unspecified: Secondary | ICD-10-CM | POA: Diagnosis not present

## 2019-09-22 DIAGNOSIS — E1165 Type 2 diabetes mellitus with hyperglycemia: Secondary | ICD-10-CM | POA: Diagnosis not present

## 2019-10-19 DIAGNOSIS — E119 Type 2 diabetes mellitus without complications: Secondary | ICD-10-CM | POA: Diagnosis not present

## 2019-12-18 DIAGNOSIS — I10 Essential (primary) hypertension: Secondary | ICD-10-CM | POA: Diagnosis not present

## 2019-12-18 DIAGNOSIS — Z7984 Long term (current) use of oral hypoglycemic drugs: Secondary | ICD-10-CM | POA: Diagnosis not present

## 2019-12-18 DIAGNOSIS — E1169 Type 2 diabetes mellitus with other specified complication: Secondary | ICD-10-CM | POA: Diagnosis not present

## 2019-12-20 DIAGNOSIS — E119 Type 2 diabetes mellitus without complications: Secondary | ICD-10-CM | POA: Diagnosis not present

## 2019-12-25 DIAGNOSIS — E119 Type 2 diabetes mellitus without complications: Secondary | ICD-10-CM | POA: Diagnosis not present

## 2020-03-06 DIAGNOSIS — Z1231 Encounter for screening mammogram for malignant neoplasm of breast: Secondary | ICD-10-CM | POA: Diagnosis not present

## 2020-03-18 DIAGNOSIS — E785 Hyperlipidemia, unspecified: Secondary | ICD-10-CM | POA: Diagnosis not present

## 2020-03-18 DIAGNOSIS — Z23 Encounter for immunization: Secondary | ICD-10-CM | POA: Diagnosis not present

## 2020-03-18 DIAGNOSIS — E1169 Type 2 diabetes mellitus with other specified complication: Secondary | ICD-10-CM | POA: Diagnosis not present

## 2020-03-18 DIAGNOSIS — Z7984 Long term (current) use of oral hypoglycemic drugs: Secondary | ICD-10-CM | POA: Diagnosis not present

## 2020-03-18 DIAGNOSIS — I10 Essential (primary) hypertension: Secondary | ICD-10-CM | POA: Diagnosis not present

## 2020-04-01 DIAGNOSIS — Z23 Encounter for immunization: Secondary | ICD-10-CM | POA: Diagnosis not present

## 2020-04-09 DIAGNOSIS — D0582 Other specified type of carcinoma in situ of left breast: Secondary | ICD-10-CM | POA: Diagnosis not present

## 2020-04-11 ENCOUNTER — Telehealth: Payer: Self-pay | Admitting: Oncology

## 2020-04-11 NOTE — Telephone Encounter (Signed)
Spoke to patient to confirm afternoon [facility] appointment for 12/1, packet emailed to patient and explained surgeon's office phone call

## 2020-04-15 ENCOUNTER — Encounter: Payer: Self-pay | Admitting: *Deleted

## 2020-04-15 ENCOUNTER — Encounter: Payer: Self-pay | Admitting: Internal Medicine

## 2020-04-22 ENCOUNTER — Encounter: Payer: Self-pay | Admitting: *Deleted

## 2020-04-23 ENCOUNTER — Other Ambulatory Visit: Payer: Self-pay | Admitting: *Deleted

## 2020-04-23 DIAGNOSIS — D0512 Intraductal carcinoma in situ of left breast: Secondary | ICD-10-CM

## 2020-04-23 NOTE — Progress Notes (Addendum)
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 She underwent left diagnostic mammography with tomography at [facility] on 03/25/2020 showing: breast density category C; 4.7 cm area of grouped calcifications in upper-inner left breast.  Accordingly on 04/09/2020 she proceeded to biopsy of the left breast area in question. The pathology from this procedure ([id number]) showed: ductal carcinoma in situ, high grade with necrosis and calcifications. Prognostic indicators significant for: estrogen receptor, 100% positive with strong staining intensity and progesterone receptor, 45% positive with moderate-strong staining intensity.   The patient's subsequent history is as detailed below.   INTERVAL HISTORY: Alisha Delgado was evaluated in the multidisciplinary breast cancer clinic on 04/24/2020 accompanied by Dr. Raenette Rover, her significant other. Her case was also presented at the multidisciplinary breast cancer conference on the same day. At that time a preliminary plan was proposed: Lumpectomy if possible, adjuvant radiation, antiestrogens, and genetics testing   REVIEW OF SYSTEMS: There were no specific  symptoms leading to the original mammogram, which was routinely scheduled. On the provided questionnaire, Alisha Delgado reports wearing glasses, possible hearing loss, back pain and moderate arthritis, headaches with occasional migraines, weakness in fingers, some forgetfulness, history of diabetes, and history of anemia requiring blood transfusions. The patient denies unusual headaches, visual changes, nausea, vomiting, stiff neck, dizziness, or gait imbalance. There has been no cough, phlegm production, or pleurisy, no chest pain or pressure, and no change in bowel or bladder habits. The patient denies fever, rash, bleeding, unexplained fatigue or unexplained weight loss.  She does not exercise regularly although she does have an exercise bike at home.  A detailed review of systems was otherwise entirely negative.   COVID 19 VACCINATION STATUS: fully vaccinated [employer]) with booster in September 2021   PAST MEDICAL HISTORY: Past Medical History:  Diagnosis Date  . Anemia   . Arthritis   . Blood transfusion   . Breast cancer ([place])   . Cataract   . Diabetes mellitus   . Heart murmur   . Hypertension   . Osteoporosis   . Ulcer     PAST SURGICAL HISTORY: Past Surgical History:  Procedure Laterality Date  . ABDOMINAL HYSTERECTOMY    . APPENDECTOMY    . BACK SURGERY    . KNEE SURGERY      FAMILY HISTORY: Family History  Problem Relation Age of Onset  . Ovarian cancer Mother   . Ovarian cancer Maternal Aunt   . Breast cancer Paternal Aunt   . Brain cancer Paternal Grandfather     Her mother died at age 15 from ovarian cancer, which was diagnosed at age 41. Her father died at age 35 from heart problems. Alisha Delgado has 2 brothers and 1 sister. In addition to her mother, a maternal aunt and two maternal cousins also had ovarian cancer. She also reports  aunts on both sides of the family with breast cancer. Finally, she also notes brain cancer in her paternal grandfather at age 38.   GYNECOLOGIC  HISTORY:  Patient's last menstrual period was 05/25/1974. Menarche: 74 years old GX P 0 LMP age 49, with hysterectomy Contraceptive: never used HRT: used following hysterectomy up until around age 27.  Hysterectomy? Yes, 1976 for endometriosis BSO? 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 She is widowed. Her current significant other, Dr. Teressa Lower, is a retired pulmonologist here in [place]. She lives by herself, HER-2 pet dogs having recently died 04/13/2020).. 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 Smokeless tobacco: Never Used  Substance Use Topics  . Alcohol use: Yes  . Drug use: Never     Colonoscopy: 04/2011 (Dr. Michail Sermon)  PAP: date unknown, s/p hysterectomy   Bone density: 12/2018, osteopenia   Allergies  Allergen Reactions  . Codeine Itching    With High Dose  Codeine   -  Causes  Itching.    Current Outpatient Medications  Medication Sig Dispense Refill  . alendronate (FOSAMAX) 70 MG tablet Take 70 mg by mouth every 7 (seven) days. Take with a full glass of water on an empty stomach.    Marland Kitchen atorvastatin (LIPITOR) 20 MG tablet Take 20 mg by mouth daily.    . Calcium Carbonate-Vitamin D (CALCIUM 600 + D PO) Take 1 tablet by mouth 3 (three) times daily.    . ferrous sulfate 325 (65 FE) MG tablet Take 325 mg by mouth 2 (two) times daily.    Marland Kitchen glimepiride (AMARYL) 4 MG tablet  Take 4 mg by mouth daily with breakfast.    . Krill Oil 300 MG CAPS Take 300 mg by mouth daily.    Marland Kitchen losartan-hydrochlorothiazide (HYZAAR) 100-12.5 MG tablet Take 1 tablet by mouth daily.    . meloxicam (MOBIC) 15 MG tablet Take 15 mg by mouth daily as needed for pain.    . metFORMIN (GLUCOPHAGE) 500 MG tablet Take 2,000 mg by mouth daily after supper.    . Multiple Vitamin (MULTIVITAMIN) tablet Take 1 tablet by mouth daily.    Marland Kitchen omeprazole (PRILOSEC) 20 MG capsule Take 20 mg by mouth daily as needed.     . rizatriptan (MAXALT) 10 MG tablet Take 10 mg by mouth as needed. May repeat in 2 hours if needed    . vitamin B-12 (CYANOCOBALAMIN) 100 MCG tablet Take 100 mcg by mouth daily.    . vitamin C (ASCORBIC ACID) 500 MG tablet Take 500 mg by mouth 2 (two) times daily.    Marland Kitchen aspirin 81 MG tablet Take 81 mg by mouth daily.     . Cholecalciferol (VITAMIN D3) 3000 UNITS TABS Take 1,000 Units by mouth 2 (two) times a week. Take 1 tab  On  Tues. &  Thurs.    Marland Kitchen  pioglitazone (ACTOS) 30 MG tablet Take 30 mg by mouth daily.     No current facility-administered medications for this visit.    OBJECTIVE: White woman in no acute distress  Vitals:   04/24/20 1245  BP: (!) 181/84  Pulse: 72  Resp: 18  Temp: 98.3 F (36.8 C)  SpO2: 96%     Body mass index is 31.71 kg/m.   Wt Readings from Last 3 Encounters:  04/24/20 169 lb 3.2 oz (76.7 kg)  02/23/14 168 lb (76.2 kg)  07/24/11 176 lb 3.2 oz (79.9 kg)      ECOG FS:1 - Symptomatic but completely ambulatory  Ocular: Sclerae unicteric, pupils round and equal Ear-nose-throat: Wearing a mask Lymphatic: No cervical or supraclavicular adenopathy Lungs no rales or rhonchi Heart regular rate and rhythm Abd soft, nontender, positive bowel sounds MSK no focal spinal tenderness, no joint edema Neuro: non-focal, well-oriented, appropriate affect Breasts: The right breast is benign.  The left breast is status post recent biopsy.  There is a minimal  ecchymosis.  There is no palpable mass.  Both axillae are benign   LAB RESULTS:  CMP     Component Value Date/Time   NA 138 04/24/2020 1223   K 4.0 04/24/2020 1223   CL 103 04/24/2020 1223   CO2 26 04/24/2020 1223   GLUCOSE 236 (H) 04/24/2020 1223   BUN 19 04/24/2020 1223   CREATININE 0.82 04/24/2020 1223   CALCIUM 10.7 (H) 04/24/2020 1223   PROT 7.1 04/24/2020 1223   ALBUMIN 3.7 04/24/2020 1223   AST 34 04/24/2020 1223   ALT 41 04/24/2020 1223   ALKPHOS 85 04/24/2020 1223   BILITOT 0.7 04/24/2020 1223   GFRNONAA >60 04/24/2020 1223    Lab Results  Component Value Date   TOTALPROTELP 6.8 06/05/2011   ALBUMINELP 60.0 06/05/2011   A1GS 5.0 (H) 06/05/2011   A2GS 11.7 06/05/2011   BETS 7.8 (H) 06/05/2011   BETA2SER 4.1 06/05/2011   GAMS 11.4 06/05/2011   MSPIKE NOT DET 06/05/2011   SPEI * 06/05/2011    Lab Results  Component Value Date   WBC 6.8 04/24/2020   NEUTROABS 4.5 04/24/2020   HGB 13.5 04/24/2020   HCT 40.3 04/24/2020   MCV 88.8 04/24/2020   PLT 280 04/24/2020    No results found for: LABCA2  No components found for: ZDGLOV564  No results for input(s): INR in the last 168 hours.  No results found for: LABCA2  No results found for: PPI951  No results found for: OAC166  No results found for: AYT016  No results found for: CA2729  No components found for: HGQUANT  No results found for: CEA1 / No results found for: CEA1   No results found for: AFPTUMOR  No results found for: CHROMOGRNA  No results found for: KPAFRELGTCHN, LAMBDASER, KAPLAMBRATIO (kappa/lambda light chains)  No results found for: HGBA, HGBA2QUANT, HGBFQUANT, HGBSQUAN (Hemoglobinopathy evaluation)   No results found for: LDH  Lab Results  Component Value Date   IRON 36 (L) 07/21/2011   TIBC 310 07/21/2011   IRONPCTSAT 12 (L) 07/21/2011   (Iron and TIBC)  Lab Results  Component Value Date   FERRITIN 66 07/21/2011    Urinalysis    Component Value Date/Time    LABSPEC 1.025 06/05/2011 1142   PHURINE 6.0 06/05/2011 1142   HGBUR Negative 06/05/2011 1142   BILIRUBINUR Negative 06/05/2011 1142   KETONESUR Negative 06/05/2011 1142   PROTEINUR < 30 06/05/2011 1142   NITRITE Negative 06/05/2011 1142  LEUKOCYTESUR Negative 06/05/2011 1142     STUDIES: No results found.   ELIGIBLE FOR AVAILABLE RESEARCH PROTOCOL: AET  ASSESSMENT: 74 y.o. [place] woman status post left breast biopsy 04/09/2020 for ductal carcinoma in situ, high-grade, estrogen and progesterone receptor positive  (1) definitive surgery pending  (2) genetics testing  (3) adjuvant radiation  (4) antiestrogens  PLAN: I met today with Alisha Delgado to review her new diagnosis. Specifically we discussed the biology of her breast cancer, its diagnosis, staging, treatment  options and prognosis. Alisha Delgado understands that in noninvasive ductal carcinoma, also called ductal carcinoma in situ ("DCIS") the breast cancer cells remain trapped in the ducts were they started. They cannot travel to a vital organ. For that reason these cancers in themselves are not life-threatening.  If the whole breast is removed then all the ducts are removed and since the cancer cells are trapped in the ducts, the cure rate with mastectomy for noninvasive breast cancer is approximately 99%. Nevertheless we recommend lumpectomy, because there is no survival advantage to mastectomy and because the cosmetic result is generally superior with breast conservation.  Since the patient is keeping her breast, there will be some risk of recurrence. The recurrence can only be in the same breast since, again, the cells are trapped in the ducts. There is no connection from one breast to the other. The risk of local recurrence is cut by more than half with radiation, which is standard in this situation.  In estrogen receptor positive cancers like Alisha Delgado, anti-estrogens can also be considered. They will further reduce the risk of  recurrence by one half. In addition anti-estrogens will lower the risk of a new breast cancer developing in either breast, also by one half. That risk otherwise approaches 1% per year.   Accordingly the overall plan is for surgery, followed by radiation, then a discussion of anti-estrogens.  Also Alisha Delgado does have a significant family history of cancer and meets criteria for genetics testing. In patients who carry a deleterious mutation [for example in a  BRCA gene], the risk of a new breast cancer developing in the future may be sufficiently great that the patient may choose bilateral mastectomies. However if she wishes to keep her breasts in that situation it is safe to do so. That would require intensified screening, which generally means not only yearly mammography but a yearly breast MRI as well.   Alisha Delgado has a good understanding of the overall plan. She agrees with it. She knows the goal of treatment in her case is cure. She will call with any problems that may develop before her next visit here.  Total encounter time 55 minutes.   Virgie Dad. Tae Vonada, MD 04/24/2020 3:52 PM Medical Oncology and Hematology [facility] [place], [place] [address] Tel. [phone]    Fax. [phone]   This document serves as a record of services personally performed by Lurline Del, MD. It was created on his behalf by Wilburn Mylar, a trained medical scribe. The creation of this record is based on the scribe's personal observations and the provider's statements to them.   I, Lurline Del MD, have reviewed the above documentation for accuracy and completeness, and I agree with the above.    *Total Encounter Time as defined by the Centers for Medicare and Medicaid Services includes, in addition to the face-to-face time of a patient visit (documented in the note above) non-face-to-face time: obtaining and reviewing outside history, ordering and reviewing medications, tests  or  procedures, care coordination (communications with other health care professionals or caregivers) and documentation in the medical record.

## 2020-04-24 ENCOUNTER — Other Ambulatory Visit: Payer: Self-pay | Admitting: Surgery

## 2020-04-24 ENCOUNTER — Inpatient Hospital Stay (HOSPITAL_BASED_OUTPATIENT_CLINIC_OR_DEPARTMENT_OTHER): Payer: Medicare Other | Admitting: Genetic Counselor

## 2020-04-24 ENCOUNTER — Other Ambulatory Visit: Payer: Self-pay

## 2020-04-24 ENCOUNTER — Inpatient Hospital Stay: Payer: Medicare Other

## 2020-04-24 ENCOUNTER — Ambulatory Visit: Payer: Medicare Other | Admitting: Physical Therapy

## 2020-04-24 ENCOUNTER — Ambulatory Visit
Admission: RE | Admit: 2020-04-24 | Discharge: 2020-04-24 | Disposition: A | Discharge status: Home / Self Care | Payer: Medicare Other | Source: Ambulatory Visit | Attending: Radiation Oncology | Admitting: Radiation Oncology

## 2020-04-24 ENCOUNTER — Inpatient Hospital Stay: Payer: Medicare Other | Attending: Oncology | Admitting: Oncology

## 2020-04-24 ENCOUNTER — Encounter: Payer: Self-pay | Admitting: Oncology

## 2020-04-24 VITALS — BP 181/84 | HR 72 | Temp 98.3°F | Resp 18 | Ht 61.25 in | Wt 169.2 lb

## 2020-04-24 DIAGNOSIS — Z803 Family history of malignant neoplasm of breast: Secondary | ICD-10-CM | POA: Diagnosis not present

## 2020-04-24 DIAGNOSIS — D0512 Intraductal carcinoma in situ of left breast: Secondary | ICD-10-CM

## 2020-04-24 DIAGNOSIS — Z170 Estrogen receptor positive status [ER+]: Secondary | ICD-10-CM | POA: Diagnosis not present

## 2020-04-24 DIAGNOSIS — Z8041 Family history of malignant neoplasm of ovary: Secondary | ICD-10-CM | POA: Diagnosis not present

## 2020-04-24 LAB — CBC WITH DIFFERENTIAL (CANCER CENTER ONLY)
Abs Immature Granulocytes: 0.02 10*3/uL (ref 0.00–0.07)
Basophils Absolute: 0.1 10*3/uL (ref 0.0–0.1)
Basophils Relative: 1 %
Eosinophils Absolute: 0.1 10*3/uL (ref 0.0–0.5)
Eosinophils Relative: 1 %
HCT: 40.3 % (ref 36.0–46.0)
Hemoglobin: 13.5 g/dL (ref 12.0–15.0)
Immature Granulocytes: 0 %
Lymphocytes Relative: 26 %
Lymphs Abs: 1.8 10*3/uL (ref 0.7–4.0)
MCH: 29.7 pg (ref 26.0–34.0)
MCHC: 33.5 g/dL (ref 30.0–36.0)
MCV: 88.8 fL (ref 80.0–100.0)
Monocytes Absolute: 0.4 10*3/uL (ref 0.1–1.0)
Monocytes Relative: 6 %
Neutro Abs: 4.5 10*3/uL (ref 1.7–7.7)
Neutrophils Relative %: 66 %
Platelet Count: 280 10*3/uL (ref 150–400)
RBC: 4.54 MIL/uL (ref 3.87–5.11)
RDW: 13.1 % (ref 11.5–15.5)
WBC Count: 6.8 10*3/uL (ref 4.0–10.5)
nRBC: 0 % (ref 0.0–0.2)

## 2020-04-24 LAB — CMP (CANCER CENTER ONLY)
ALT: 41 U/L (ref 0–44)
AST: 34 U/L (ref 15–41)
Albumin: 3.7 g/dL (ref 3.5–5.0)
Alkaline Phosphatase: 85 U/L (ref 38–126)
Anion gap: 9 (ref 5–15)
BUN: 19 mg/dL (ref 8–23)
CO2: 26 mmol/L (ref 22–32)
Calcium: 10.7 mg/dL — ABNORMAL HIGH (ref 8.9–10.3)
Chloride: 103 mmol/L (ref 98–111)
Creatinine: 0.82 mg/dL (ref 0.44–1.00)
GFR, Estimated: >60 mL/min (ref >60)
Glucose, Bld: 236 mg/dL — ABNORMAL HIGH (ref 70–99)
Potassium: 4 mmol/L (ref 3.5–5.1)
Sodium: 138 mmol/L (ref 135–145)
Total Bilirubin: 0.7 mg/dL (ref 0.3–1.2)
Total Protein: 7.1 g/dL (ref 6.5–8.1)

## 2020-04-24 LAB — GENETIC SCREENING ORDER

## 2020-04-24 NOTE — Progress Notes (Signed)
Radiation Oncology         [phone]) [phone] ________________________________  Name: Alisha Delgado        MRN: [id number]  Date of Service: 04/24/2020 DOB: 1945-12-09  [id number], Ronie Spies, MD  Coralie Keens, MD     REFERRING PHYSICIAN: Coralie Keens, MD   DIAGNOSIS: The encounter diagnosis was Ductal carcinoma in situ (DCIS) of left breast.   HISTORY OF PRESENT ILLNESS: Alisha Delgado is a 74 y.o. female seen in the multidisciplinary breast clinic for a new diagnosis of left breast cancer. The patient was noted to have screening detected calcifications in the left breast, she also has a family history of cancer of the breast.  Diagnostic imaging revealed a grouping of calcifications in the 9 o'clock position measuring 4.7 cm in greatest dimension.  She did undergo a biopsy on 04/09/20 which revealed a ER/PR positive high-grade DCIS.  She is seen today to discuss treatment recommendations for her cancer.    PREVIOUS RADIATION THERAPY: No   PAST MEDICAL HISTORY:  Past Medical History:  Diagnosis Date  . Anemia   . Arthritis   . Blood transfusion   . Cataract   . Diabetes mellitus   . Heart murmur   . Hypertension   . Osteoporosis   . Ulcer ([place])        PAST SURGICAL HISTORY: Past Surgical History:  Procedure Laterality Date  . ABDOMINAL HYSTERECTOMY    . APPENDECTOMY    . BACK SURGERY    . KNEE SURGERY       FAMILY HISTORY: No family history on file.   SOCIAL HISTORY:  reports that she has quit smoking. She does not have any smokeless tobacco history on file.  The patient is widowed and resides in [place].  She is in a relationship with Dr. Lenna Gilford who accompanies her. She has three adult children.    ALLERGIES: Codeine   MEDICATIONS:  Current Outpatient Medications  Medication Sig Dispense Refill  . alendronate (FOSAMAX) 70 MG tablet Take 70 mg by mouth every 7 (seven) days. Take with a full glass of water on an empty stomach.    Marland Kitchen aspirin 81 MG tablet  Take 81 mg by mouth daily.     . Calcium Carbonate-Vitamin D (CALCIUM 600 + D PO) Take 1 tablet by mouth 3 (three) times daily.    . Cholecalciferol (VITAMIN D3) 3000 UNITS TABS Take 1,000 Units by mouth 2 (two) times a week. Take 1 tab  On  Tues. &  Thurs.    . ferrous sulfate 325 (65 FE) MG tablet Take 325 mg by mouth 2 (two) times daily.    Javier Docker Oil 300 MG CAPS Take 300 mg by mouth daily.    Marland Kitchen losartan-hydrochlorothiazide (HYZAAR) 50-12.5 MG tablet TK 1 T PO  ONCE DAILY  4  . metformin (FORTAMET) 1000 MG (OSM) 24 hr tablet Take 1,000 mg by mouth daily with breakfast.    . Multiple Vitamin (MULTIVITAMIN) tablet Take 1 tablet by mouth daily.    Marland Kitchen omeprazole (PRILOSEC) 20 MG capsule Take 20 mg by mouth daily as needed.     . pioglitazone (ACTOS) 30 MG tablet Take 30 mg by mouth daily.    . rizatriptan (MAXALT) 10 MG tablet Take 10 mg by mouth as needed. May repeat in 2 hours if needed    . vitamin C (ASCORBIC ACID) 500 MG tablet Take 500 mg by mouth 2 (two) times daily.     No current facility-administered medications  for this encounter.     REVIEW OF SYSTEMS: On review of systems, the patient reports that she is doing well overall. She denies any concerns specifically to the breast.     PHYSICAL EXAM:  Wt Readings from Last 3 Encounters:  02/23/14 168 lb (76.2 kg)  07/24/11 176 lb 3.2 oz (79.9 kg)  06/05/11 174 lb 14.4 oz (79.3 kg)   Temp Readings from Last 3 Encounters:  05/29/15 98.6 F (37 C)  02/23/14 98.3 F (36.8 C) (Oral)  07/24/11 97.4 F (36.3 C) (Oral)   BP Readings from Last 3 Encounters:  05/29/15 138/76  04/17/15 127/78  04/10/15 (!) 148/87   Pulse Readings from Last 3 Encounters:  05/29/15 70  04/17/15 82  04/10/15 77    In general this is a well appearing caucasian  female in no acute distress. She's alert and oriented x4 and appropriate throughout the examination. Cardiopulmonary assessment is negative for acute distress and she exhibits normal  effort. Bilateral breast exam is deferred.    ECOG = 0  0 - Asymptomatic (Fully active, able to carry on all predisease activities without restriction)  1 - Symptomatic but completely ambulatory (Restricted in physically strenuous activity but ambulatory and able to carry out work of a light or sedentary nature. For example, light housework, office work)  2 - Symptomatic, <50% in bed during the day (Ambulatory and capable of all self care but unable to carry out any work activities. Up and about more than 50% of waking hours)  3 - Symptomatic, >50% in bed, but not bedbound (Capable of only limited self-care, confined to bed or chair 50% or more of waking hours)  4 - Bedbound (Completely disabled. Cannot carry on any self-care. Totally confined to bed or chair)  5 - Death   Eustace Pen MM, Creech RH, Tormey DC, et al. [phone]). "Toxicity and response criteria of the [facility]". [place] Oncol. 5 (6): 649-55    LABORATORY DATA:  Lab Results  Component Value Date   WBC 4.7 04/10/2015   HGB 12.2 04/10/2015   HCT 37.4 04/10/2015   MCV 90.8 04/10/2015   PLT 345 04/10/2015   Lab Results  Component Value Date   NA 140 07/21/2011   K 4.3 07/21/2011   CL 106 07/21/2011   CO2 22 07/21/2011   Lab Results  Component Value Date   ALT 26 06/05/2011   AST 28 06/05/2011   ALKPHOS 84 06/05/2011   BILITOT 0.4 06/05/2011      RADIOGRAPHY: No results found.     IMPRESSION/PLAN: 1. ER/PR positive high-grade DCIS of the left breast.Dr. Lisbeth Renshaw discusses the pathology findings and reviews the nature of noninvasive left breast disease. The consensus from the breast conference includes either consideration of mastectomy or if she desires breast conservation MRI for extent of disease.  Dr. Lisbeth Renshaw discusses the rationale for adjuvant external radiotherapy to the breast  to reduce risks of local recurrence if she did undergo breast conservation surgery, this would be followed  by antiestrogen therapy. We discussed the risks, benefits, short, and long term effects of radiotherapy, as well as the curative intent, and the patient is interested in proceeding. Dr. Lisbeth Renshaw discusses the delivery and logistics of radiotherapy and anticipates a course of 4 weeks of radiotherapy with her current work-up to the left breast with deep inspiration breath-hold technique. We will see her back a few weeks after surgery to discuss the simulation process and anticipate we starting radiotherapy about  4-6 weeks after surgery.  2.  Possible genetic predisposition to malignancy. The patient is a candidate for genetic testing given herpersonal and family history. She was offered referral and will meet with genetics today.   In a visit lasting 60 minutes, greater than 50% of the time was spent face to face reviewing her case, as well as in preparation of, discussing, and coordinating the patient's care.  The above documentation reflects my direct findings during this shared patient visit. Please see the separate note by Dr. Lisbeth Renshaw on this date for the remainder of the patient's plan of care.    Carola Rhine, PAC

## 2020-04-25 ENCOUNTER — Encounter: Payer: Self-pay | Admitting: Genetic Counselor

## 2020-04-25 ENCOUNTER — Encounter: Payer: Self-pay | Admitting: General Practice

## 2020-04-25 ENCOUNTER — Encounter: Payer: Self-pay | Admitting: *Deleted

## 2020-04-25 DIAGNOSIS — Z803 Family history of malignant neoplasm of breast: Secondary | ICD-10-CM

## 2020-04-25 DIAGNOSIS — Z8041 Family history of malignant neoplasm of ovary: Secondary | ICD-10-CM

## 2020-04-25 HISTORY — DX: Family history of malignant neoplasm of ovary: Z80.41

## 2020-04-25 HISTORY — DX: Family history of malignant neoplasm of breast: Z80.3

## 2020-04-25 NOTE — Progress Notes (Signed)
REFERRING PROVIDER: Chauncey Cruel, MD [address] [place],  [place] [address]  PRIMARY PROVIDER:  Leeroy Cha, MD  PRIMARY REASON FOR VISIT:  1. Ductal carcinoma in situ (DCIS) of left breast   2. Family history of breast cancer   3. Family history of ovarian cancer    HISTORY OF PRESENT ILLNESS:   Ms. Klinger, a 74 y.o. female, was seen for a [place] cancer genetics consultation at the request of Dr. Jana Hakim during the breast multidisciplinary clinic due to a personal history of ductal carcinoma in situ (DCIS).  Ms. Sizer presents to clinic today to discuss the possibility of a hereditary predisposition to cancer, genetic testing, and to further clarify her future cancer risks, as well as potential cancer risks for family members.   In 2021, at the age of 57, Ms. Florek was diagnosed with ductal carcinoma in situ (DCIS) of the left breast.  The preliminary treatment plan includes lumpectomy, radiation, and anti-estrogens.   RISK FACTORS:  Menarche was at age 73.  Nulliparous.  OCP use for approximately 0 years.  Ovaries intact: no.  Hysterectomy: yes.  TAH/BSO at age 67 Menopausal status: postmenopausal.  HRT use: over 30 years. Colonoscopy: yes; most recent in 2012 per patient. Mammogram within the last year: yes.  Past Medical History:  Diagnosis Date  . Anemia   . Arthritis   . Blood transfusion   . Breast cancer ([place])   . Cataract   . Diabetes mellitus   . Family history of breast cancer 04/25/2020  . Family history of ovarian cancer 04/25/2020  . Heart murmur   . Hypertension   . Osteoporosis   . Ulcer     Past Surgical History:  Procedure Laterality Date  . ABDOMINAL HYSTERECTOMY    . APPENDECTOMY    . BACK SURGERY    . KNEE SURGERY      Social History   Socioeconomic History  . Marital status: Widowed    Spouse name: Not on file  . Number of children: Not on file  . Years of education: Not on file  . Highest education level: Not on  file  Occupational History  . Not on file  Tobacco Use  . 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 Smokeless tobacco: Never Used  Substance and Sexual Activity  . Alcohol use: Yes  . Drug use: Never  . Sexual activity: Not on file  Other Topics Concern  . Not on file  Social History Narrative  . Not on file   Social Determinants of Health   Financial Resource Strain:   . Difficulty of Paying Living Expenses: Not on file  Food Insecurity:   . Worried About Charity fundraiser in the Last Year: Not on file  . Ran Out of Food in the Last Year: Not on file  Transportation Needs:   . Lack of Transportation (Medical): Not on file  . Lack of Transportation (Non-Medical): Not on file  Physical Activity:   . Days of Exercise per Week: Not on file  . Minutes of Exercise per Session: Not on file  Stress:   . Feeling of Stress : Not on file  Social Connections:   . Frequency of Communication with Friends and Family: Not on file  . Frequency of Social Gatherings with Friends and Family: Not on file  . Attends Religious Services: Not on file  . Active Member of Clubs or Organizations: Not on file  . Attends Archivist Meetings: Not  on file  . Marital Status: Not on file     FAMILY HISTORY:  We obtained a detailed, 4-generation family history.  Significant diagnoses are listed below: Family History  Problem Relation Age of Onset  . Ovarian cancer Mother 13  . Ovarian cancer Maternal Aunt 80  . Breast cancer Maternal Aunt 90  . Breast cancer Paternal Aunt        dx 71s  . Brain cancer Paternal Grandfather 36  . Ovarian cancer Cousin 41  . Ovarian cancer Cousin        dx 106s  . Breast cancer Cousin Early 2s       maternal female cousin  . Breast cancer Cousin        maternal female cousin; dx unknown age  . Breast cancer Cousin        maternal female cousin; dx unknown age      Ms. Zick has three children in their 34s and early 10s who were adopted into the family.  She  has one sister and two brothers, none of whom have had cancer.    Ms. Condrey's mother was diagnosed with ovarian cancer at age 60 and passed away at age 4.  Ms. Fetting's maternal aunt was diagnosed with ovarian cancer around age 36 and breast cancer around age 33.  Ms. Begue has two maternal cousins with a history of ovarian cancer, diagnosed in their 41s and 7s.  Ms. Heffner also has two female maternal cousin with breast cancer.  Ms. Keizer has one female maternal cousin with a history of breast cancer at age 27.  No other maternal family history of cancer was reported.   Ms. Crispen's father passed away at age 105 and did not have cancer.  Ms. Blizzard reported breast cancer in her paternal aunt in her 59s.  Ms. Ek's paternal grandfather was diagnosed with brain cancer at age 72 and passed away at age 72. No other paternal family history of cancer was reported.    Ms. Captain is unaware of previous family history of genetic testing for hereditary cancer risks. Patient's maternal and paternal ancestors are of [place], [place], [place], and [place] descent.There is no reported Ashkenazi Jewish ancestry. There is no known consanguinity.  GENETIC COUNSELING ASSESSMENT: Ms. Lea is a 74 y.o. female with a personal and family history of cancer which is somewhat suggestive of a hereditary cancer syndrome, such as Hereditary Breast and Ovarian Cancer Syndrome, and predisposition to cancer given the related diagnoses in multiple generations of the family. We, therefore, discussed and recommended the following at today's visit.   DISCUSSION: We discussed that 5 - 10% of cancer is hereditary, with most cases of hereditary breast cancer associated with mutations in BRCA1/2.  There are other genes that can be associated with hereditary breast and ovarian cancer syndromes.  Type of cancer risk and level of risk are gene-specific.  We discussed that testing is beneficial for several reasons including knowing how to follow individuals after  completing their treatment, identifying whether potential treatment options would be beneficial, and understanding if other family members could be at risk for cancer and allowing them to undergo genetic testing.   We reviewed the characteristics, features and inheritance patterns of hereditary cancer syndromes. We also discussed genetic testing, including the appropriate family members to test, the process of testing, insurance coverage and turn-around-time for results. We discussed the implications of a negative, positive and/or variant of uncertain significant result. In order to get genetic test results  in a timely manner so that Ms. Gunderson can use these genetic test results for surgical decisions, we recommended Ms. Bachicha pursue genetic testing for the STAT Breast Cancer Panel. The STAT Breast cancer panel offered by [facility] includes sequencing and rearrangement analysis for the following 9 genes:  ATM, BRCA1, BRCA2, CDH1, CHEK2, PALB2, PTEN, STK11 and TP53. Once complete, we recommend Ms. Spates pursue reflex genetic testing to the Common Hereditary Cancers Panel.  The Common Hereditary Cancers Panel offered by [facility] includes sequencing and/or deletion duplication testing of the following 48 genes: APC, ATM, AXIN2, BARD1, BMPR1A, BRCA1, BRCA2, BRIP1, CDH1, CDK4, CDKN2A (p14ARF), CDKN2A (p16INK4a), CHEK2, CTNNA1, DICER1, EPCAM (Deletion/duplication testing only), GREM1 (promoter region deletion/duplication testing only), KIT, MEN1, MLH1, MSH2, MSH3, MSH6, MUTYH, NBN, NF1, NHTL1, PALB2, PDGFRA, PMS2, POLD1, POLE, PTEN, RAD50, RAD51C, RAD51D, RNF43, SDHB, SDHC, SDHD, SMAD4, SMARCA4. STK11, TP53, TSC1, TSC2, and VHL.  The following genes were evaluated for sequence changes only: SDHA and HOXB13 c.251G>A variant only.  Based on Ms. Bransfield's personal history of DCIS and family history of breast and ovarian cancer, she meets medical criteria for genetic testing. Despite that she meets criteria, she may still have an out  of pocket cost. We discussed that if her out of pocket cost for testing is over $100, the laboratory will call and confirm whether she wants to proceed with testing.  If the out of pocket cost of testing is less than $100 she will be billed by the genetic testing laboratory.   PLAN: After considering the risks, benefits, and limitations, Ms. Sanfilippo provided informed consent to pursue genetic testing and the blood sample was sent to [facility] for analysis of the STAT+Common Hereditary Cancers Panel. Results should be available within approximately 1-2 weeks' time, at which point they will be disclosed by telephone to Ms. Kontos, as will any additional recommendations warranted by these results. Ms. Dodge will receive a summary of her genetic counseling visit and a copy of her results once available. This information will also be available in Epic.   Lastly, we encouraged Ms. Piloto to remain in contact with cancer genetics annually so that we can continuously update the family history and inform her of any changes in cancer genetics and testing that may be of benefit for this family.   Ms. Hakim's questions were answered to her satisfaction today. Our contact information was provided should additional questions or concerns arise. Thank you for the referral and allowing [place] to share in the care of your patient.   Alasia Enge M. Joette Catching, [place], [place] Film/video [email or web address] (P) [phone]  The patient was seen for a total of 20 minutes in face-to-face genetic counseling.  This patient was discussed with Drs. Magrinat, Lindi Adie and/or Burr Medico who agrees with the above.  ___________________________________________________________ For Office Staff:  Number of people involved in session: 1 Was an Intern/ student involved with case: no

## 2020-04-25 NOTE — Progress Notes (Signed)
[facility] Psychosocial Distress Screening Spiritual Care  Left voicemail for [facility] following Breast Multidisciplinary Clinic to introduce [place] team/resources, reviewing distress screen per protocol and encouraging return call.  The patient scored a 4 on the Psychosocial Distress Thermometer which indicates moderate distress.   ONCBCN DISTRESS SCREENING 04/25/2020  Screening Type Initial Screening  Distress experienced in past week (1-10) 4  Emotional problem type Nervousness/Anxiety;Adjusting to illness  Information Concerns Type Lack of info about treatment  Referral to support programs Yes     Follow up needed: No. Alisha Delgado received full packet of [facility] team and programming information.    [place], [place], [facility] Pager [phone] Voicemail [phone]

## 2020-05-02 ENCOUNTER — Telehealth: Payer: Self-pay | Admitting: *Deleted

## 2020-05-02 ENCOUNTER — Encounter: Payer: Self-pay | Admitting: *Deleted

## 2020-05-02 DIAGNOSIS — D0512 Intraductal carcinoma in situ of left breast: Secondary | ICD-10-CM

## 2020-05-02 NOTE — Progress Notes (Signed)
Your procedure is scheduled on Monday May 07, 2020.  Report to [facility] Main Entrance "A" at 07:00 A.M., and check in at the Admitting office.  Call this number if you have problems the morning of surgery: [phone]  Call [phone] if you have any questions prior to your surgery date Monday-Friday 8am-4pm   Remember: Do not eat after midnight the night before your surgery  You may drink clear liquids until 06:00 A.M the morning of your surgery.   Clear liquids allowed are: Water, Non-Citrus Juices (without pulp), Carbonated Beverages, Clear Tea, Black Coffee Only, and Gatorade  Please complete your PRE-SURGERY WATER that was provided to you by 06:00 A.M the morning of your surgery.  Please, if able, drink it in one setting. DO NOT SIP.    Take these medicines the morning of surgery with A SIP OF WATER: omeprazole (PRILOSEC)  As of today, STOP taking any Aspirin (unless otherwise instructed by your surgeon), Aleve, Naproxen, Ibuprofen, Motrin, Advil, Goody's, BC's, all herbal medications, fish oil, and all vitamins.  WHAT DO I DO ABOUT MY DIABETES MEDICATION?   THE NIGHT BEFORE SURGERY  glimepiride (AMARYL) - NO evening dose    metFORMIN (GLUCOPHAGE) - take as usual     THE MORNING OF SURGERY  glimepiride (AMARYL) - NONE  metFORMIN (GLUCOPHAGE) - NONE   . DO NOT take oral diabetic medication the morning of surgery.  . The day of surgery, do not take other diabetes injectables, including Byetta (exenatide), Bydureon (exenatide ER), Victoza (liraglutide), or Trulicity (dulaglutide).  . If your CBG is greater than 220 mg/dL, you may take  of your sliding scale (correction) dose of insulin.   HOW TO MANAGE YOUR DIABETES BEFORE AND AFTER SURGERY  Why is it important to control my blood sugar before and after surgery? . Improving blood sugar levels before and after surgery helps healing and can limit problems. . A way of improving blood sugar control is eating  a healthy diet by: o  Eating less sugar and carbohydrates o  Increasing activity/exercise o  Talking with your doctor about reaching your blood sugar goals . High blood sugars (greater than 180 mg/dL) can raise your risk of infections and slow your recovery, so you will need to focus on controlling your diabetes during the weeks before surgery. . Make sure that the doctor who takes care of your diabetes knows about your planned surgery including the date and location.  How do I manage my blood sugar before surgery? . Check your blood sugar at least 4 times a day, starting 2 days before surgery, to make sure that the level is not too high or low. . Check your blood sugar the morning of your surgery when you wake up and every 2 hours until you get to the Short Stay unit. o If your blood sugar is less than 70 mg/dL, you will need to treat for low blood sugar: - Do not take insulin. - Treat a low blood sugar (less than 70 mg/dL) with  cup of clear juice (cranberry or apple), 4 glucose tablets, OR glucose gel. - Recheck blood sugar in 15 minutes after treatment (to make sure it is greater than 70 mg/dL). If your blood sugar is not greater than 70 mg/dL on recheck, call [phone] for further instructions. . Report your blood sugar to the short stay nurse when you get to Short Stay.  . If you are admitted to the hospital after surgery: o Your blood sugar will  be checked by the staff and you will probably be given insulin after surgery (instead of oral diabetes medicines) to make sure you have good blood sugar levels. o The goal for blood sugar control after surgery is 80-180 mg/dL.    The Morning of Surgery  Do not wear jewelry, make-up or nail polish.  Do not wear lotions, powders, or perfumes, or deodorant  Do not shave 48 hours prior to surgery.    Do not bring valuables to the hospital.  [facility] is not responsible for any belongings or valuables.  If you are a smoker, DO NOT Smoke 24  hours prior to surgery  If you wear a CPAP at night please bring your mask the morning of surgery   Remember that you must have someone to transport you home after your surgery, and remain with you for 24 hours if you are discharged the same day.   Please bring cases for contacts, glasses, hearing aids, dentures or bridgework because it cannot be worn into surgery.    Leave your suitcase in the car.  After surgery it may be brought to your room.  For patients admitted to the hospital, discharge time will be determined by your treatment team.  Patients discharged the day of surgery will not be allowed to drive home.    Special instructions:   [place]- Preparing For Surgery  Before surgery, you can play an important role. Because skin is not sterile, your skin needs to be as free of germs as possible. You can reduce the number of germs on your skin by washing with CHG (chlorahexidine gluconate) Soap before surgery.  CHG is an antiseptic cleaner which kills germs and bonds with the skin to continue killing germs even after washing.    Oral Hygiene is also important to reduce your risk of infection.  Remember - BRUSH YOUR TEETH THE MORNING OF SURGERY WITH YOUR REGULAR TOOTHPASTE  Please do not use if you have an allergy to CHG or antibacterial soaps. If your skin becomes reddened/irritated stop using the CHG.  Do not shave (including legs and underarms) for at least 48 hours prior to first CHG shower. It is OK to shave your face.  Please follow these instructions carefully.   1. Shower the NIGHT BEFORE SURGERY and the MORNING OF SURGERY with CHG Soap.   2. If you chose to wash your hair and body, wash as usual with your normal shampoo and body-wash/soap.  3. Rinse your hair and body thoroughly to remove the shampoo and soap.  4. Apply CHG directly to the skin (ONLY FROM THE NECK DOWN) and wash gently with a scrungie or a clean washcloth.   5. Do not use on open wounds or open  sores. Avoid contact with your eyes, ears, mouth and genitals (private parts). Wash Face and genitals (private parts)  with your normal soap.   6. Wash thoroughly, paying special attention to the area where your surgery will be performed.  7. Thoroughly rinse your body with warm water from the neck down.  8. DO NOT shower/wash with your normal soap after using and rinsing off the CHG Soap.  9. Pat yourself dry with a CLEAN TOWEL.  10. Wear CLEAN PAJAMAS to bed the night before surgery  11. Place CLEAN SHEETS on your bed the night of your first shower and DO NOT SLEEP WITH PETS.  12. Wear comfortable clothes the morning of surgery.     Day of Surgery:  Please shower  the morning of surgery with the CHG soap Do not apply any deodorants/lotions. Please wear clean clothes to the hospital/surgery center.   Remember to brush your teeth WITH YOUR REGULAR TOOTHPASTE.   Please read over the following fact sheets that you were given.

## 2020-05-02 NOTE — Telephone Encounter (Signed)
Left vm regarding [facility] from 12.1.21. Contact information provided for questions or needs.

## 2020-05-03 ENCOUNTER — Other Ambulatory Visit: Payer: Self-pay

## 2020-05-03 ENCOUNTER — Encounter (HOSPITAL_COMMUNITY): Payer: Self-pay

## 2020-05-03 ENCOUNTER — Ambulatory Visit (HOSPITAL_COMMUNITY)
Admission: RE | Admit: 2020-05-03 | Discharge: 2020-05-03 | Disposition: A | Discharge status: Home / Self Care | Payer: Medicare Other | Source: Ambulatory Visit | Attending: Surgery | Admitting: Surgery

## 2020-05-03 ENCOUNTER — Other Ambulatory Visit (HOSPITAL_COMMUNITY)
Admission: RE | Admit: 2020-05-03 | Discharge: 2020-05-03 | Disposition: A | Discharge status: Home / Self Care | Payer: Medicare Other | Source: Ambulatory Visit | Attending: Surgery | Admitting: Surgery

## 2020-05-03 ENCOUNTER — Encounter (HOSPITAL_COMMUNITY)
Admission: RE | Admit: 2020-05-03 | Discharge: 2020-05-03 | Disposition: A | Discharge status: Home / Self Care | Payer: Medicare Other | Source: Ambulatory Visit | Attending: Surgery | Admitting: Surgery

## 2020-05-03 DIAGNOSIS — Z20822 Contact with and (suspected) exposure to covid-19: Secondary | ICD-10-CM | POA: Diagnosis not present

## 2020-05-03 DIAGNOSIS — K449 Diaphragmatic hernia without obstruction or gangrene: Secondary | ICD-10-CM | POA: Diagnosis not present

## 2020-05-03 DIAGNOSIS — Z01818 Encounter for other preprocedural examination: Secondary | ICD-10-CM

## 2020-05-03 DIAGNOSIS — I517 Cardiomegaly: Secondary | ICD-10-CM | POA: Diagnosis not present

## 2020-05-03 DIAGNOSIS — N6320 Unspecified lump in the left breast, unspecified quadrant: Secondary | ICD-10-CM | POA: Diagnosis not present

## 2020-05-03 HISTORY — DX: Headache, unspecified: R51.9

## 2020-05-03 HISTORY — DX: Pneumonia, unspecified organism: J18.9

## 2020-05-03 LAB — CBC
HCT: 39.7 % (ref 36.0–46.0)
Hemoglobin: 13.2 g/dL (ref 12.0–15.0)
MCH: 30.6 pg (ref 26.0–34.0)
MCHC: 33.2 g/dL (ref 30.0–36.0)
MCV: 91.9 fL (ref 80.0–100.0)
Platelets: 325 10*3/uL (ref 150–400)
RBC: 4.32 MIL/uL (ref 3.87–5.11)
RDW: 13.1 % (ref 11.5–15.5)
WBC: 7.3 10*3/uL (ref 4.0–10.5)
nRBC: 0 % (ref 0.0–0.2)

## 2020-05-03 LAB — BASIC METABOLIC PANEL
Anion gap: 11 (ref 5–15)
BUN: 18 mg/dL (ref 8–23)
CO2: 28 mmol/L (ref 22–32)
Calcium: 9.9 mg/dL (ref 8.9–10.3)
Chloride: 98 mmol/L (ref 98–111)
Creatinine, Ser: 0.73 mg/dL (ref 0.44–1.00)
GFR, Estimated: >60 mL/min (ref >60)
Glucose, Bld: 138 mg/dL — ABNORMAL HIGH (ref 70–99)
Potassium: 3.5 mmol/L (ref 3.5–5.1)
Sodium: 137 mmol/L (ref 135–145)

## 2020-05-03 LAB — GLUCOSE, CAPILLARY: Glucose-Capillary: 138 mg/dL — ABNORMAL HIGH (ref 70–99)

## 2020-05-03 LAB — SARS CORONAVIRUS 2 (TAT 6-24 HRS): SARS Coronavirus 2: NEGATIVE

## 2020-05-03 NOTE — Progress Notes (Signed)
PCP - Dr. Leeroy Cha Cardiologist - Denies  Chest x-ray - 05/03/20 EKG - 05/03/20 Stress Test - :"Many years ago, atypical pain" F/U with PCP ECHO - Denies Cardiac Cath - Denies  Sleep Study - No OSA  DM - type II CBG at PAT appt 138 - had just eaten lunch within 45 minutes Fasting Blood Sugar - 85-120 Checks Blood Sugar __daily not as much lately   ERAS Protcol -Yes PRE-SURGERY water given  COVID TEST- 05/03/20  Anesthesia review: No   Patient denies shortness of breath, fever, cough and chest pain at PAT appointment   All instructions explained to the patient, with a verbal understanding of the material. Patient agrees to go over the instructions while at home for a better understanding. Patient also instructed to self quarantine after being tested for COVID-19. The opportunity to ask questions was provided.

## 2020-05-06 ENCOUNTER — Encounter (HOSPITAL_COMMUNITY): Payer: Self-pay

## 2020-05-06 ENCOUNTER — Encounter: Payer: Self-pay | Admitting: Genetic Counselor

## 2020-05-06 ENCOUNTER — Other Ambulatory Visit: Payer: Self-pay | Admitting: Radiation Oncology

## 2020-05-06 ENCOUNTER — Telehealth: Payer: Self-pay | Admitting: Genetic Counselor

## 2020-05-06 ENCOUNTER — Inpatient Hospital Stay
Admission: RE | Admit: 2020-05-06 | Discharge: 2020-05-06 | Disposition: A | Discharge status: Home / Self Care | Payer: Self-pay | Source: Ambulatory Visit | Attending: Radiation Oncology | Admitting: Radiation Oncology

## 2020-05-06 DIAGNOSIS — Z1379 Encounter for other screening for genetic and chromosomal anomalies: Secondary | ICD-10-CM | POA: Insufficient documentation

## 2020-05-06 DIAGNOSIS — C50919 Malignant neoplasm of unspecified site of unspecified female breast: Secondary | ICD-10-CM

## 2020-05-06 DIAGNOSIS — C50912 Malignant neoplasm of unspecified site of left female breast: Secondary | ICD-10-CM | POA: Diagnosis not present

## 2020-05-06 DIAGNOSIS — C50212 Malignant neoplasm of upper-inner quadrant of left female breast: Secondary | ICD-10-CM | POA: Diagnosis not present

## 2020-05-06 NOTE — Telephone Encounter (Signed)
Contacted patient in attempt to disclose results of genetic testing.  LVM with contact information requesting a call back.  

## 2020-05-06 NOTE — H&P (Addendum)
Bari Mantis Appointment: 04/24/2020 1:00 PM Location: [facility] Patient #: [id number] DOB: 1945/11/11 Widowed / Language: Cleophus Molt / Race: White Female   History of Present Illness Nathaneil Canary A. Ninfa Linden MD; 04/24/2020 1:35 PM) The patient is a 74 year old female who presents with breast cancer.  Chief complaint: Ductal carcinoma in situ, high-grade left breast   This is a pleasant 74 year old female referred for the recent diagnosis of ductal carcinoma in situ of the left breast. She had an area found on screening mammography in the left breast. These were calcifications spanning a 4.5 cm area. She underwent a biopsy showing high-grade DCIS. It is at the 9 o'clock position of the breast. It is 100% ER positive and 45% PR positive. She has only had a previous infected cyst excised in the skin of the right breast. She does have a significant family history of multiple malignancies. She denies nipple discharge. She is otherwise without complaints.   Past Surgical History Conni Slipper, RN; 04/24/2020 8:01 AM) Anal Fissure Repair  Appendectomy  Breast Biopsy  Left. Cataract Surgery  Bilateral. Knee Surgery  Bilateral, Left. Splenectomy   Diagnostic Studies History Conni Slipper, RN; 04/24/2020 8:01 AM) Colonoscopy  5-10 years ago  Medication History Conni Slipper, RN; 04/24/2020 8:01 AM) Medications Reconciled  Social History Conni Slipper, RN; 04/24/2020 8:01 AM) Alcohol use  Occasional alcohol use. Caffeine use  Coffee, Tea. No drug use  Tobacco use  Former smoker.  Family History Conni Slipper, RN; 04/24/2020 8:01 AM) Arthritis  Father, Mother. Cerebrovascular Accident  Family Members In General. Colon Polyps  Father. Diabetes Mellitus  Mother. Heart Disease  Family Members In General, Father. Hypertension  Father, Mother. Ovarian Cancer  Family Members In General, Mother.  Other Problems Conni Slipper, RN; 04/24/2020 8:01 AM) Arthritis  Back Pain   Breast Cancer  Diabetes Mellitus  Diverticulosis  Gastric Ulcer  Heart murmur  High blood pressure  Hypercholesterolemia  Lump In Breast  Migraine Headache  Transfusion history     Review of Systems Conni Slipper RN; 04/24/2020 8:01 AM) General Not Present- Appetite Loss, Chills, Fatigue, Fever, Night Sweats, Weight Gain and Weight Loss. Skin Not Present- Change in Wart/Mole, Dryness, Hives, Jaundice, New Lesions, Non-Healing Wounds, Rash and Ulcer. HEENT Present- Wears glasses/contact lenses. Not Present- Earache, Hearing Loss, Hoarseness, Nose Bleed, Oral Ulcers, Ringing in the Ears, Seasonal Allergies, Sinus Pain, Sore Throat, Visual Disturbances and Yellow Eyes. Respiratory Not Present- Bloody sputum, Chronic Cough, Difficulty Breathing, Snoring and Wheezing. Breast Present- Breast Mass. Not Present- Breast Pain, Nipple Discharge and Skin Changes. Cardiovascular Not Present- Chest Pain, Difficulty Breathing Lying Down, Leg Cramps, Palpitations, Rapid Heart Rate, Shortness of Breath and Swelling of Extremities. Gastrointestinal Not Present- Abdominal Pain, Bloating, Bloody Stool, Change in Bowel Habits, Chronic diarrhea, Constipation, Difficulty Swallowing, Excessive gas, Gets full quickly at meals, Hemorrhoids, Indigestion, Nausea, Rectal Pain and Vomiting. Musculoskeletal Not Present- Back Pain, Joint Pain, Joint Stiffness, Muscle Pain, Muscle Weakness and Swelling of Extremities. Neurological Not Present- Decreased Memory, Fainting, Headaches, Numbness, Seizures, Tingling, Tremor, Trouble walking and Weakness. Psychiatric Not Present- Anxiety, Bipolar, Change in Sleep Pattern, Depression, Fearful and Frequent crying. Endocrine Not Present- Cold Intolerance, Excessive Hunger, Hair Changes, Heat Intolerance, Hot flashes and New Diabetes. Hematology Present- Easy Bruising. Not Present- Blood Thinners, Excessive bleeding, Gland problems, HIV and Persistent  Infections.   Physical Exam (Tyshawna Alarid A. Ninfa Linden MD; 04/24/2020 1:35 PM) The physical exam findings are as follows: Note: She appears well on exam  There is  a hematoma near the medial edge of the areola from the biopsy of the left breast. There are no skin changes. There is no axillary adenopathy. There are no other palpable abnormalities in the breast.    Assessment & Plan (Shakaya Bhullar A. Ninfa Linden MD; 04/24/2020 1:38 PM) DUCTAL CARCINOMA IN SITU (DCIS) OF LEFT BREAST (D05.12) Impression: I have reviewed her notes and electronic medical records. I reviewed her mammogram, ultrasound, and biopsy results. We have also discussed her in detail in our multidisciplinary breast cancer conference today. She has a 4.5 cm area of DCIS of the left breast. I discussed the diagnosis with her in detail. We discussed that this is a very early stage. We then discussed breast conservation with a radioactive seed guided lumpectomy followed with radiation versus mastectomy. I believe she has enough size in her breast to attempt breast conservation. I discussed the surgical procedure in detail. I discussed the risk which includes but is not limited to bleeding, infection, injury to surrounding structures, the need for further surgery if margins are positive, cardiopulmonary issues, postoperative recovery, etc. She understands and wished to proceed with breast conservation. Surgery will be scheduled as soon as possible

## 2020-05-07 ENCOUNTER — Encounter (HOSPITAL_COMMUNITY)
Admission: RE | Disposition: A | Discharge status: Home / Self Care | Payer: Self-pay | Source: Home / Self Care | Attending: Surgery

## 2020-05-07 ENCOUNTER — Ambulatory Visit (HOSPITAL_COMMUNITY): Payer: Medicare Other | Admitting: Anesthesiology

## 2020-05-07 ENCOUNTER — Encounter (HOSPITAL_COMMUNITY): Payer: Self-pay | Admitting: Surgery

## 2020-05-07 ENCOUNTER — Ambulatory Visit (HOSPITAL_COMMUNITY): Payer: Medicare Other | Admitting: Physician Assistant

## 2020-05-07 ENCOUNTER — Ambulatory Visit (HOSPITAL_COMMUNITY)
Admission: RE | Admit: 2020-05-07 | Discharge: 2020-05-07 | Disposition: A | Discharge status: Home / Self Care | Payer: Medicare Other | Attending: Surgery | Admitting: Surgery

## 2020-05-07 DIAGNOSIS — C50212 Malignant neoplasm of upper-inner quadrant of left female breast: Secondary | ICD-10-CM | POA: Diagnosis not present

## 2020-05-07 DIAGNOSIS — N6082 Other benign mammary dysplasias of left breast: Secondary | ICD-10-CM | POA: Diagnosis not present

## 2020-05-07 DIAGNOSIS — Z87891 Personal history of nicotine dependence: Secondary | ICD-10-CM | POA: Diagnosis not present

## 2020-05-07 DIAGNOSIS — C50912 Malignant neoplasm of unspecified site of left female breast: Secondary | ICD-10-CM | POA: Diagnosis not present

## 2020-05-07 DIAGNOSIS — D0512 Intraductal carcinoma in situ of left breast: Secondary | ICD-10-CM | POA: Insufficient documentation

## 2020-05-07 DIAGNOSIS — L723 Sebaceous cyst: Secondary | ICD-10-CM | POA: Diagnosis not present

## 2020-05-07 DIAGNOSIS — I10 Essential (primary) hypertension: Secondary | ICD-10-CM | POA: Diagnosis not present

## 2020-05-07 DIAGNOSIS — Z170 Estrogen receptor positive status [ER+]: Secondary | ICD-10-CM | POA: Insufficient documentation

## 2020-05-07 DIAGNOSIS — Z8041 Family history of malignant neoplasm of ovary: Secondary | ICD-10-CM | POA: Diagnosis not present

## 2020-05-07 DIAGNOSIS — D630 Anemia in neoplastic disease: Secondary | ICD-10-CM | POA: Diagnosis not present

## 2020-05-07 HISTORY — PX: BREAST LUMPECTOMY WITH RADIOACTIVE SEED LOCALIZATION: SHX6424

## 2020-05-07 LAB — GLUCOSE, CAPILLARY
Glucose-Capillary: 161 mg/dL — ABNORMAL HIGH (ref 70–99)
Glucose-Capillary: 148 mg/dL — ABNORMAL HIGH (ref 70–99)

## 2020-05-07 SURGERY — BREAST LUMPECTOMY WITH RADIOACTIVE SEED LOCALIZATION
Anesthesia: General | Site: Breast | Laterality: Left

## 2020-05-07 MED ORDER — ONDANSETRON HCL 4 MG/2ML IJ SOLN
INTRAMUSCULAR | Status: AC
  Filled 2020-05-07: qty 2

## 2020-05-07 MED ORDER — CHLORHEXIDINE GLUCONATE CLOTH 2 % EX PADS: 6.0000 | MEDICATED_PAD | Freq: Once | CUTANEOUS | Status: DC

## 2020-05-07 MED ORDER — CHLORHEXIDINE GLUCONATE 0.12 % MT SOLN
15.0000 mL | Freq: Once | OROMUCOSAL | Status: AC
  Administered 2020-05-07: 15 mL via OROMUCOSAL
  Filled 2020-05-07: qty 15

## 2020-05-07 MED ORDER — ORAL CARE MOUTH RINSE: 15.0000 mL | Freq: Once | OROMUCOSAL | Status: AC

## 2020-05-07 MED ORDER — CHLORHEXIDINE GLUCONATE 0.12 % MT SOLN: 15.0000 mL | Freq: Once | OROMUCOSAL | Status: DC

## 2020-05-07 MED ORDER — 0.9 % SODIUM CHLORIDE (POUR BTL) OPTIME
TOPICAL | Status: DC | PRN
  Administered 2020-05-07: 1000 mL

## 2020-05-07 MED ORDER — GABAPENTIN 300 MG PO CAPS
300.0000 mg | ORAL_CAPSULE | ORAL | Status: AC
  Administered 2020-05-07: 300 mg via ORAL
  Filled 2020-05-07: qty 1

## 2020-05-07 MED ORDER — DEXAMETHASONE SODIUM PHOSPHATE 10 MG/ML IJ SOLN
INTRAMUSCULAR | Status: AC
  Filled 2020-05-07: qty 1

## 2020-05-07 MED ORDER — LIDOCAINE 2% (20 MG/ML) 5 ML SYRINGE
INTRAMUSCULAR | Status: AC
  Filled 2020-05-07: qty 5

## 2020-05-07 MED ORDER — TRAMADOL HCL 50 MG PO TABS: 50.0000 mg | ORAL_TABLET | Freq: Four times a day (QID) | ORAL | 0 refills | Status: DC | PRN

## 2020-05-07 MED ORDER — EPHEDRINE 5 MG/ML INJ
INTRAVENOUS | Status: AC
  Filled 2020-05-07: qty 10

## 2020-05-07 MED ORDER — DEXAMETHASONE SODIUM PHOSPHATE 10 MG/ML IJ SOLN
INTRAMUSCULAR | Status: DC | PRN
  Administered 2020-05-07: 10 mg via INTRAVENOUS

## 2020-05-07 MED ORDER — CEFAZOLIN SODIUM-DEXTROSE 2-4 GM/100ML-% IV SOLN
2.0000 g | INTRAVENOUS | Status: AC
  Administered 2020-05-07: 2 g via INTRAVENOUS
  Filled 2020-05-07: qty 100

## 2020-05-07 MED ORDER — LIDOCAINE 2% (20 MG/ML) 5 ML SYRINGE
INTRAMUSCULAR | Status: DC | PRN
  Administered 2020-05-07: 80 mg via INTRAVENOUS

## 2020-05-07 MED ORDER — ENSURE PRE-SURGERY PO LIQD: 296.0000 mL | Freq: Once | ORAL | Status: DC

## 2020-05-07 MED ORDER — PHENYLEPHRINE HCL-NACL 10-0.9 MG/250ML-% IV SOLN
INTRAVENOUS | Status: DC | PRN
  Administered 2020-05-07: 40 ug/min via INTRAVENOUS

## 2020-05-07 MED ORDER — FENTANYL CITRATE (PF) 100 MCG/2ML IJ SOLN: 25.0000 ug | INTRAMUSCULAR | Status: DC | PRN

## 2020-05-07 MED ORDER — LACTATED RINGERS IV SOLN: INTRAVENOUS | Status: DC

## 2020-05-07 MED ORDER — PROPOFOL 10 MG/ML IV BOLUS
INTRAVENOUS | Status: DC | PRN
  Administered 2020-05-07: 150 mg via INTRAVENOUS

## 2020-05-07 MED ORDER — EPHEDRINE SULFATE-NACL 50-0.9 MG/10ML-% IV SOSY
PREFILLED_SYRINGE | INTRAVENOUS | Status: DC | PRN
  Administered 2020-05-07: 10 mg via INTRAVENOUS
  Administered 2020-05-07: 5 mg via INTRAVENOUS

## 2020-05-07 MED ORDER — ONDANSETRON HCL 4 MG/2ML IJ SOLN: 4.0000 mg | Freq: Once | INTRAMUSCULAR | Status: DC | PRN

## 2020-05-07 MED ORDER — FENTANYL CITRATE (PF) 250 MCG/5ML IJ SOLN
INTRAMUSCULAR | Status: DC | PRN
  Administered 2020-05-07: 50 ug via INTRAVENOUS

## 2020-05-07 MED ORDER — BUPIVACAINE-EPINEPHRINE 0.25% -1:200000 IJ SOLN
INTRAMUSCULAR | Status: DC | PRN
  Administered 2020-05-07: 20 mL

## 2020-05-07 MED ORDER — ACETAMINOPHEN 500 MG PO TABS
1000.0000 mg | ORAL_TABLET | ORAL | Status: AC
  Administered 2020-05-07: 1000 mg via ORAL
  Filled 2020-05-07: qty 2

## 2020-05-07 MED ORDER — FENTANYL CITRATE (PF) 250 MCG/5ML IJ SOLN
INTRAMUSCULAR | Status: AC
  Filled 2020-05-07: qty 5

## 2020-05-07 MED ORDER — ONDANSETRON HCL 4 MG/2ML IJ SOLN
INTRAMUSCULAR | Status: DC | PRN
  Administered 2020-05-07: 4 mg via INTRAVENOUS

## 2020-05-07 MED ORDER — BUPIVACAINE HCL (PF) 0.25 % IJ SOLN
INTRAMUSCULAR | Status: AC
  Filled 2020-05-07: qty 30

## 2020-05-07 MED ORDER — PROPOFOL 10 MG/ML IV BOLUS
INTRAVENOUS | Status: AC
  Filled 2020-05-07: qty 20

## 2020-05-07 SURGICAL SUPPLY — 36 items
ADH SKN CLS APL DERMABOND .7 (GAUZE/BANDAGES/DRESSINGS) ×1
APL PRP STRL LF DISP 70% ISPRP (MISCELLANEOUS) ×1
APPLIER CLIP 9.375 MED OPEN (MISCELLANEOUS) ×3
APR CLP MED 9.3 20 MLT OPN (MISCELLANEOUS) ×1
BINDER BREAST LRG (GAUZE/BANDAGES/DRESSINGS) ×2 IMPLANT
BINDER BREAST XLRG (GAUZE/BANDAGES/DRESSINGS) IMPLANT
CANISTER SUCT 3000ML PPV (MISCELLANEOUS) ×3 IMPLANT
CHLORAPREP W/TINT 26 (MISCELLANEOUS) ×3 IMPLANT
CLIP APPLIE 9.375 MED OPEN (MISCELLANEOUS) ×1 IMPLANT
COVER PROBE W GEL 5X96 (DRAPES) ×3 IMPLANT
COVER SURGICAL LIGHT HANDLE (MISCELLANEOUS) ×3 IMPLANT
COVER WAND RF STERILE (DRAPES) ×3 IMPLANT
DERMABOND ADVANCED (GAUZE/BANDAGES/DRESSINGS) ×2
DERMABOND ADVANCED .7 DNX12 (GAUZE/BANDAGES/DRESSINGS) ×1 IMPLANT
DEVICE DUBIN SPECIMEN MAMMOGRA (MISCELLANEOUS) ×3 IMPLANT
DRAPE CHEST BREAST 15X10 FENES (DRAPES) ×3 IMPLANT
ELECT CAUTERY BLADE 6.4 (BLADE) ×3 IMPLANT
ELECT REM PT RETURN 9FT ADLT (ELECTROSURGICAL) ×3
ELECTRODE REM PT RTRN 9FT ADLT (ELECTROSURGICAL) ×1 IMPLANT
GLOVE SURG SIGNA 7.5 PF LTX (GLOVE) ×3 IMPLANT
GOWN STRL REUS W/ TWL LRG LVL3 (GOWN DISPOSABLE) ×1 IMPLANT
GOWN STRL REUS W/ TWL XL LVL3 (GOWN DISPOSABLE) ×1 IMPLANT
GOWN STRL REUS W/TWL LRG LVL3 (GOWN DISPOSABLE) ×3
GOWN STRL REUS W/TWL XL LVL3 (GOWN DISPOSABLE) ×3
KIT BASIN OR (CUSTOM PROCEDURE TRAY) ×3 IMPLANT
KIT MARKER MARGIN INK (KITS) ×3 IMPLANT
NDL HYPO 25GX1X1/2 BEV (NEEDLE) ×1 IMPLANT
NEEDLE HYPO 25GX1X1/2 BEV (NEEDLE) ×3 IMPLANT
NS IRRIG 1000ML POUR BTL (IV SOLUTION) IMPLANT
PACK GENERAL/GYN (CUSTOM PROCEDURE TRAY) ×3 IMPLANT
PAD ABD 8X10 STRL (GAUZE/BANDAGES/DRESSINGS) ×2 IMPLANT
SUT MNCRL AB 4-0 PS2 18 (SUTURE) ×3 IMPLANT
SUT VIC AB 3-0 SH 18 (SUTURE) ×3 IMPLANT
SYR CONTROL 10ML LL (SYRINGE) ×3 IMPLANT
TOWEL GREEN STERILE (TOWEL DISPOSABLE) ×3 IMPLANT
TOWEL GREEN STERILE FF (TOWEL DISPOSABLE) ×3 IMPLANT

## 2020-05-07 NOTE — Transfer of Care (Signed)
Immediate Anesthesia Transfer of Care Note  Patient: Alisha Delgado  Procedure(s) Performed: LEFT BREAST LUMPECTOMY WITH RADIOACTIVE SEED LOCALIZATION (Left Breast)  Patient Location: PACU  Anesthesia Type:General  Level of Consciousness: awake, alert  and oriented  Airway & Oxygen Therapy: Patient Spontanous Breathing  Post-op Assessment: Report given to RN, Post -op Vital signs reviewed and stable and Patient moving all extremities X 4  Post vital signs: Reviewed and stable  Last Vitals:  Vitals Value Taken Time  BP 136/76 05/07/20 0846  Temp    Pulse 70 05/07/20 0848  Resp 22 05/07/20 0848  SpO2 97 % 05/07/20 0848  Vitals shown include unvalidated device data.  Last Pain:  Vitals:   05/07/20 0727  TempSrc:   PainSc: 0-No pain      Patients Stated Pain Goal: 4 (46/21/94 7125)  Complications: No complications documented.

## 2020-05-07 NOTE — Discharge Instructions (Signed)
[place] Office Phone Number [phone]  BREAST BIOPSY/ PARTIAL MASTECTOMY: POST OP INSTRUCTIONS  Always review your discharge instruction sheet given to you by the facility where your surgery was performed.  IF YOU HAVE DISABILITY OR FAMILY LEAVE FORMS, YOU MUST BRING THEM TO THE OFFICE FOR PROCESSING.  DO NOT GIVE THEM TO YOUR DOCTOR.  1. A prescription for pain medication may be given to you upon discharge.  Take your pain medication as prescribed, if needed.  If narcotic pain medicine is not needed, then you may take acetaminophen (Tylenol) or ibuprofen (Advil) as needed. 2. Take your usually prescribed medications unless otherwise directed 3. If you need a refill on your pain medication, please contact your pharmacy.  They will contact our office to request authorization.  Prescriptions will not be filled after 5pm or on week-ends. 4. You should eat very light the first 24 hours after surgery, such as soup, crackers, pudding, etc.  Resume your normal diet the day after surgery. 5. Most patients will experience some swelling and bruising in the breast.  Ice packs and a good support bra will help.  Swelling and bruising can take several days to resolve.  6. It is common to experience some constipation if taking pain medication after surgery.  Increasing fluid intake and taking a stool softener will usually help or prevent this problem from occurring.  A mild laxative (Milk of Magnesia or Miralax) should be taken according to package directions if there are no bowel movements after 48 hours. 7. Unless discharge instructions indicate otherwise, you may remove your bandages 24-48 hours after surgery, and you may shower at that time.  You may have steri-strips (small skin tapes) in place directly over the incision.  These strips should be left on the skin for 7-10 days.  If your surgeon used skin glue on the incision, you may shower in 24 hours.  The glue will flake off over the  next 2-3 weeks.  Any sutures or staples will be removed at the office during your follow-up visit. 8. ACTIVITIES:  You may resume regular daily activities (gradually increasing) beginning the next day.  Wearing a good support bra or sports bra minimizes pain and swelling.  You may have sexual intercourse when it is comfortable. a. You may drive when you no longer are taking prescription pain medication, you can comfortably wear a seatbelt, and you can safely maneuver your car and apply brakes. b. RETURN TO WORK:  ______________________________________________________________________________________ 9. You should see your doctor in the office for a follow-up appointment approximately two weeks after your surgery.  Your doctor's nurse will typically make your follow-up appointment when she calls you with your pathology report.  Expect your pathology report 2-3 business days after your surgery.  You may call to check if you do not hear from [place] after three days. 10. OTHER INSTRUCTIONS:OK TO REMOVE THE BINDER AND SHOWER STARTING TOMORROW 11. ICE PACK, TYLENOL, AND IBUPROFEN ALSO FOR PAIN 12. NO VIGOROUS ACTIVITY FOR ONE WEEKS _______________________________________________________________________________________________ _____________________________________________________________________________________________________________________________________ _____________________________________________________________________________________________________________________________________ _____________________________________________________________________________________________________________________________________  WHEN TO CALL YOUR DOCTOR: 1. Fever over 101.0 2. Nausea and/or vomiting. 3. Extreme swelling or bruising. 4. Continued bleeding from incision. 5. Increased pain, redness, or drainage from the incision.  The clinic staff is available to answer your questions during regular business hours.   Please don't hesitate to call and ask to speak to one of the nurses for clinical concerns.  If you have a medical emergency, go to the nearest emergency room or  call 911.  A surgeon from [facility] is always on call at the hospital.  For further questions, please visit [email or web address]

## 2020-05-07 NOTE — Anesthesia Procedure Notes (Signed)
Procedure Name: LMA Insertion Date/Time: 05/07/2020 7:55 AM Performed by: Kyung Rudd, CRNA Pre-anesthesia Checklist: Patient identified, Emergency Drugs available, Suction available and Patient being monitored Patient Re-evaluated:Patient Re-evaluated prior to induction Oxygen Delivery Method: Circle system utilized Preoxygenation: Pre-oxygenation with 100% oxygen Induction Type: IV induction LMA: LMA inserted LMA Size: 4.0 Number of attempts: 1 Placement Confirmation: positive ETCO2 and breath sounds checked- equal and bilateral Tube secured with: Tape Dental Injury: Teeth and Oropharynx as per pre-operative assessment

## 2020-05-07 NOTE — Interval H&P Note (Signed)
History and Physical Interval Note:no change in H and P except for a small sebaceous cyst has recurred less than 41mm on the same left breast so will excise along with the left breast DCIS  05/07/2020 7:47 AM  Alisha Delgado  has presented today for surgery, with the diagnosis of LEFT BREAST DUCTAL CARCINOMA IN SITUE.  The various methods of treatment have been discussed with the patient and family. After consideration of risks, benefits and other options for treatment, the patient has consented to  Procedure(s): LEFT BREAST LUMPECTOMY WITH RADIOACTIVE SEED LOCALIZATION (Left) as a surgical intervention as well as EXCISION OF LEFT BREAST SEBACEOUS CYST.  The patient's history has been reviewed, patient examined, no change in status, stable for surgery.  I have reviewed the patient's chart and labs.  Questions were answered to the patient's satisfaction.     Coralie Keens

## 2020-05-07 NOTE — Anesthesia Preprocedure Evaluation (Addendum)
Anesthesia Evaluation  Patient identified by MRN, date of birth, ID band Patient awake    Reviewed: Allergy & Precautions, NPO status , Patient's Chart, lab work & pertinent test results  History of Anesthesia Complications Negative for: history of anesthetic complications  Airway Mallampati: II  TM Distance: >3 FB Neck ROM: Full    Dental  (+) Teeth Intact   Pulmonary former smoker,    Pulmonary exam normal breath sounds clear to auscultation       Cardiovascular hypertension, Pt. on medications Normal cardiovascular exam Rhythm:Regular Rate:Normal     Neuro/Psych  Headaches, negative psych ROS   GI/Hepatic Neg liver ROS, GERD  Controlled and Medicated,  Endo/Other  diabetes, Type 2, Oral Hypoglycemic AgentsObesity   Renal/GU negative Renal ROS     Musculoskeletal  (+) Arthritis ,   Abdominal   Peds  Hematology negative hematology ROS (+)   Anesthesia Other Findings Day of surgery medications reviewed with the patient.  Reproductive/Obstetrics                            Anesthesia Physical Anesthesia Plan  ASA: II  Anesthesia Plan: General   Post-op Pain Management:    Induction: Intravenous  PONV Risk Score and Plan: 3 and Dexamethasone, Ondansetron and Treatment may vary due to age or medical condition  Airway Management Planned: LMA  Additional Equipment:   Intra-op Plan:   Post-operative Plan: Extubation in OR  Informed Consent: I have reviewed the patients History and Physical, chart, labs and discussed the procedure including the risks, benefits and alternatives for the proposed anesthesia with the patient or authorized representative who has indicated his/her understanding and acceptance.       Plan Discussed with: CRNA  Anesthesia Plan Comments:         Anesthesia Quick Evaluation

## 2020-05-07 NOTE — Op Note (Signed)
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  After long discussion with the patient, she wishes to proceed with a radioactive seed guided left breast lumpectomy.  On the morning of surgery, she also indicated an intermittently draining sebaceous cyst that has recurred on the left breast by the sternum.  It is only about 4 mm in size.  The decision was made to excise this as well  Procedure: The patient was brought to operating room identifies correct patient.  She is placed upon the operating table general anesthesia was induced.  Her left breast and chest were then prepped and draped in usual sterile fashion.  I anesthetized the skin at the very small sebaceous cyst with lidocaine and then excised the 4 mm cyst with a scalpel.  Hemostasis was achieved with the cautery.  Next, the radioactive seed was located at the 9 o'clock position of the left breast about 6 cm from the nipple.  I anesthetized the medial edge of the areola with Marcaine and make a circumareolar incision with a scalpel.  I then dissected down into the breast tissue with electrocautery.  The seed in the area of DCIS were very superficial.  I stayed directly underneath the skin dissecting medially several centimeters past the seed.  I then took this down to the chest wall.  I then dissected inferior and  superior move back toward the nipple areolar complex and went several centimeters past the seed as well.  I then completed the lumpectomy with the aid of the electrocautery and neoprobe staying underneath the seed.  Once the lumpectomy specimen was removed I marked all margins with paint.  An x-ray was performed informed that the seed and previous tissue marker and calcifications were in the center of the specimen.  The specimen was then sent to pathology for evaluation.  I achieved hemostasis with the cautery.  I placed several surgical clips around the periphery of the lumpectomy site.  I then closed subcutaneous tissue with interrupted 3-0 Vicryl sutures and closed skin with running 4-0 Monocryl.  Dermabond was placed to both incisions.  The patient was then placed in a breast binder.  She tolerated the procedure well.  All the counts were correct at the end of the procedure.  The patient was then extubated in the operating room and taken in stable addition to the recovery room.          Coralie Keens   Date: 05/07/2020  Time: 8:39 AM

## 2020-05-07 NOTE — Anesthesia Postprocedure Evaluation (Signed)
Anesthesia Post Note  Patient: Alisha Delgado  Procedure(s) Performed: LEFT BREAST LUMPECTOMY WITH RADIOACTIVE SEED LOCALIZATION (Left Breast)     Patient location during evaluation: PACU Anesthesia Type: General Level of consciousness: awake and alert, awake and oriented Pain management: pain level controlled Vital Signs Assessment: post-procedure vital signs reviewed and stable Respiratory status: spontaneous breathing, nonlabored ventilation and respiratory function stable Cardiovascular status: blood pressure returned to baseline and stable Postop Assessment: no apparent nausea or vomiting Anesthetic complications: no   No complications documented.  Last Vitals:  Vitals:   05/07/20 0901 05/07/20 0904  BP: 138/73 132/69  Pulse: (!) 57 (!) 56  Resp: 13 15  Temp:  36.5 C  SpO2: 97% 97%    Last Pain:  Vitals:   05/07/20 0904  TempSrc:   PainSc: [place]

## 2020-05-08 ENCOUNTER — Ambulatory Visit: Payer: Self-pay | Admitting: Genetic Counselor

## 2020-05-08 ENCOUNTER — Encounter (HOSPITAL_COMMUNITY): Payer: Self-pay | Admitting: Surgery

## 2020-05-08 DIAGNOSIS — Z1379 Encounter for other screening for genetic and chromosomal anomalies: Secondary | ICD-10-CM

## 2020-05-08 DIAGNOSIS — D0512 Intraductal carcinoma in situ of left breast: Secondary | ICD-10-CM

## 2020-05-08 DIAGNOSIS — Z8041 Family history of malignant neoplasm of ovary: Secondary | ICD-10-CM

## 2020-05-08 DIAGNOSIS — Z803 Family history of malignant neoplasm of breast: Secondary | ICD-10-CM

## 2020-05-08 NOTE — Progress Notes (Signed)
HPI:  Alisha Delgado was previously seen in the [facility] due to a personal and family history of cancer and concerns regarding a hereditary predisposition to cancer. Please refer to our prior cancer genetics clinic note for more information regarding our discussion, assessment and recommendations, at the time. Alisha Delgado's recent genetic test results were disclosed to her, as were recommendations warranted by these results. These results and recommendations are discussed in more detail below.  CANCER HISTORY:  Oncology History  Ductal carcinoma in situ (DCIS) of left breast  04/23/2020 Initial Diagnosis   Ductal carcinoma in situ (DCIS) of left breast   05/04/2020 Genetic Testing   Negative genetic testing: no pathogenic variants detected in [facility] Common Hereditary Cancers Panel.  Variants of uncertain significance detected in APC c.6694C>G (p.His2232Asp), POLD1 c.632G>A (p.Arg211His), and RNF43 c.667C>T (p.Arg223Cys).  The report date is May 04, 2020.   The Common Hereditary Cancers Panel offered by [facility] includes sequencing and/or deletion duplication testing of the following 48 genes: APC, ATM, AXIN2, BARD1, BMPR1A, BRCA1, BRCA2, BRIP1, CDH1, CDK4, CDKN2A (p14ARF), CDKN2A (p16INK4a), CHEK2, CTNNA1, DICER1, EPCAM (Deletion/duplication testing only), GREM1 (promoter region deletion/duplication testing only), KIT, MEN1, MLH1, MSH2, MSH3, MSH6, MUTYH, NBN, NF1, NHTL1, PALB2, PDGFRA, PMS2, POLD1, POLE, PTEN, RAD50, RAD51C, RAD51D, RNF43, SDHB, SDHC, SDHD, SMAD4, SMARCA4. STK11, TP53, TSC1, TSC2, and VHL.  The following genes were evaluated for sequence changes only: SDHA and HOXB13 c.251G>A variant only.     FAMILY HISTORY:  We obtained a detailed, 4-generation family history.  Significant diagnoses are listed below: Family History  Problem Relation Age of Onset  . Ovarian cancer Mother 9  . Ovarian cancer Maternal Aunt 80  . Breast cancer Maternal Aunt 90  . Breast  cancer Paternal Aunt        dx 62s  . Brain cancer Paternal Grandfather 13  . Ovarian cancer Cousin 41  . Ovarian cancer Cousin        dx 25s  . Breast cancer Cousin 68       maternal female cousin  . Breast cancer Cousin        maternal female cousin; dx unknown age  . Breast cancer Cousin        maternal female cousin; dx unknown age       Alisha Delgado has three children in their 30s and early 46s who were adopted into the family.  She has one sister and two brothers, none of whom have had cancer.    Alisha Delgado's mother was diagnosed with ovarian cancer at age 82 and passed away at age 45.  Alisha Delgado's maternal aunt was diagnosed with ovarian cancer around age 4 and breast cancer around age 47.  Alisha Delgado has two maternal cousins with a history of ovarian cancer, diagnosed in their 82s and 56s.  Alisha Delgado also has two female maternal cousin with breast cancer.  Alisha Delgado has one female maternal cousin with a history of breast cancer at age 9.  No other maternal family history of cancer was reported.   Alisha Delgado's father passed away at age 69 and did not have cancer.  Alisha Delgado reported breast cancer in her paternal aunt in her 40s.  Alisha Delgado's paternal grandfather was diagnosed with brain cancer at age 51 and passed away at age 93. No other paternal family history of cancer was reported.    Alisha Delgado is unaware of previous family history of genetic testing for hereditary cancer risks. Patient's maternal  and paternal ancestors are of [place], [place], [place], and [place] descent.There is no reported Ashkenazi Jewish ancestry. There is no known consanguinity  GENETIC TEST RESULTS: Genetic testing reported out on May 04, 2020.  The Common Hereditary Cancers Panel found no pathogenic mutations. The Common Hereditary Cancers Panel offered by [facility] includes sequencing and/or deletion duplication testing of the following 48 genes: APC, ATM, AXIN2, BARD1, BMPR1A, BRCA1, BRCA2, BRIP1, CDH1, CDK4, CDKN2A (p14ARF),  CDKN2A (p16INK4a), CHEK2, CTNNA1, DICER1, EPCAM (Deletion/duplication testing only), GREM1 (promoter region deletion/duplication testing only), KIT, MEN1, MLH1, MSH2, MSH3, MSH6, MUTYH, NBN, NF1, NHTL1, PALB2, PDGFRA, PMS2, POLD1, POLE, PTEN, RAD50, RAD51C, RAD51D, RNF43, SDHB, SDHC, SDHD, SMAD4, SMARCA4. STK11, TP53, TSC1, TSC2, and VHL.  The following genes were evaluated for sequence changes only: SDHA and HOXB13 c.251G>A variant only.  The test report has been scanned into EPIC and is located under the Molecular Pathology section of the Results Review tab.  A portion of the result report is included below for reference.       We discussed with Alisha Delgado that because current genetic testing is not perfect, it is possible there may be a gene mutation in one of these genes that current testing cannot detect, but that chance is small.  We also discussed, that there could be another gene that has not yet been discovered, or that we have not yet tested, that is responsible for the cancer diagnoses in the family. It is also possible there is a hereditary cause for the cancer in the family that Alisha Delgado did not inherit and therefore was not identified in her testing.  Therefore, it is important to remain in touch with cancer genetics in the future so that we can continue to offer Alisha Delgado the most up to date genetic testing.   Genetic testing did identify three variants of uncertain significance (VUS) - one in the APC gene called c.6694C>G, a second in the POLD1 gene called c.632G>A, and a third in the RNF43 gene called c.667C>T.  At this time, it is unknown if these variants are associated with increased cancer risk or if they are normal findings, but most variants such as these get reclassified to being inconsequential. They should not be used to make medical management decisions. With time, we suspect the lab will determine the significance of these variants, if any. If we do learn more about them, we will try  to contact Alisha Delgado to discuss it further. However, it is important to stay in touch with [place] periodically and keep the address and phone number up to date.   ADDITIONAL GENETIC TESTING: We discussed with Ms. Calip that there are other genes that are associated with increased cancer risk that can be analyzed. Should Ms. Mazzocco wish to pursue additional genetic testing, we are happy to discuss and coordinate this testing, at any time.    CANCER SCREENING RECOMMENDATIONS: Ms. Grundman's test result is considered negative (normal).  This means that we have not identified a hereditary cause for her personal and family history of cancer at this time. Most cancers happen by chance and this negative test suggests that her cancer may fall into this category.    Given Ms. Micheletti's family histories, we must interpret these negative results with some caution.  Families with features suggestive of hereditary risk for cancer tend to have multiple family members with cancer, diagnoses in multiple generations and diagnoses before the age of 54. Ms. Laforge's family exhibits some of these features. Thus, this  result may simply reflect our current inability to detect all mutations within these genes or there may be a different gene that has not yet been discovered or tested.   An individual's cancer risk and medical management are not determined by genetic test results alone. Overall cancer risk assessment incorporates additional factors, including personal medical history, family history, and any available genetic information that may result in a personalized plan for cancer prevention and surveillance.  RECOMMENDATIONS FOR FAMILY MEMBERS:  Individuals in this family might be at some increased risk of developing cancer, over the general population risk, simply due to the family history of cancer.  We recommended women in this family have a yearly mammogram beginning at age 67, or 37 years younger than the earliest onset of cancer, an  annual clinical breast exam, and perform monthly breast self-exams. Women in this family should also have a gynecological exam as recommended by their primary provider. All family members should be referred for colonoscopy starting at age 77.  It is also possible there is a hereditary cause for the cancer in Ms. Mcwethy's family that she did not inherit and therefore was not identified in her.  Based on Ms. Bleich's family history of ovarian cancer in her mother, we recommended her siblings have genetic counseling and testing. Ms. Venezia will let us know if we can be of any assistance in coordinating genetic counseling and/or testing for this family member.   FOLLOW-UP: Lastly, we discussed with Ms. Seif that cancer genetics is a rapidly advancing field and it is possible that new genetic tests will be appropriate for her and/or her family members in the future. We encouraged her to remain in contact with cancer genetics on an annual basis so we can update her personal and family histories and let her know of advances in cancer genetics that may benefit this family.   Our contact number was provided. Ms. Slaugh's questions were answered to her satisfaction, and she knows she is welcome to call us at anytime with additional questions or concerns.   Alisha Delgado M. Joette Catching, [place], [facility] Genetic Counselor [email or web address] (P) [phone]

## 2020-05-08 NOTE — Telephone Encounter (Signed)
Revealed negative genetic testing and variant of uncertain significance in APC, POLD1, and RNF43.  Discussed that we do not know why Alisha Delgado has cancer or why there is cancer in the family. It could be sporadic/familial, due to a different gene that we are not testing, or maybe our current technology may not be able to pick something up.  It will be important for her to keep in contact with genetics to keep up with whether additional testing may be needed.

## 2020-05-09 LAB — SURGICAL PATHOLOGY

## 2020-05-13 ENCOUNTER — Encounter: Payer: Self-pay | Admitting: *Deleted

## 2020-06-05 ENCOUNTER — Ambulatory Visit
Admission: RE | Admit: 2020-06-05 | Discharge: 2020-06-05 | Disposition: A | Discharge status: Home / Self Care | Payer: Medicare Other | Source: Ambulatory Visit | Attending: Radiation Oncology | Admitting: Radiation Oncology

## 2020-06-05 ENCOUNTER — Other Ambulatory Visit: Payer: Self-pay

## 2020-06-05 ENCOUNTER — Encounter: Payer: Self-pay | Admitting: Radiation Oncology

## 2020-06-05 VITALS — BP 161/80 | HR 77 | Temp 97.6°F | Resp 20 | Ht 61.25 in | Wt 171.1 lb

## 2020-06-05 DIAGNOSIS — K219 Gastro-esophageal reflux disease without esophagitis: Secondary | ICD-10-CM | POA: Diagnosis not present

## 2020-06-05 DIAGNOSIS — Z170 Estrogen receptor positive status [ER+]: Secondary | ICD-10-CM | POA: Insufficient documentation

## 2020-06-05 DIAGNOSIS — Z8041 Family history of malignant neoplasm of ovary: Secondary | ICD-10-CM | POA: Insufficient documentation

## 2020-06-05 DIAGNOSIS — I10 Essential (primary) hypertension: Secondary | ICD-10-CM | POA: Insufficient documentation

## 2020-06-05 DIAGNOSIS — E119 Type 2 diabetes mellitus without complications: Secondary | ICD-10-CM | POA: Diagnosis not present

## 2020-06-05 DIAGNOSIS — D0512 Intraductal carcinoma in situ of left breast: Secondary | ICD-10-CM | POA: Diagnosis not present

## 2020-06-05 DIAGNOSIS — M129 Arthropathy, unspecified: Secondary | ICD-10-CM | POA: Diagnosis not present

## 2020-06-05 DIAGNOSIS — Z803 Family history of malignant neoplasm of breast: Secondary | ICD-10-CM | POA: Diagnosis not present

## 2020-06-05 DIAGNOSIS — Z7984 Long term (current) use of oral hypoglycemic drugs: Secondary | ICD-10-CM | POA: Insufficient documentation

## 2020-06-05 DIAGNOSIS — M810 Age-related osteoporosis without current pathological fracture: Secondary | ICD-10-CM | POA: Diagnosis not present

## 2020-06-05 DIAGNOSIS — Z79899 Other long term (current) drug therapy: Secondary | ICD-10-CM | POA: Diagnosis not present

## 2020-06-05 NOTE — Progress Notes (Signed)
Patient returns to discuss adjuvant radiation therapy. Weight stable. BP elevated. Patient explains her pharmacy has been out of her Losartan. Patient explains she is scheduled to follow up with her surgeon, Dr. Rush Farmer, on June 10, 2020. Patient explains she fell recently and sprained both wrist. Left breast surgical incision well approximated without redness, drainage or edema. Patient struggles to raise her arms above her head related to bilateral sprain wrist. Patient denies left breast pain. She reports occasional twinges in her left breast that last only briefly and resolve without intervention. No evidence of lymphedema noted.   BP (!) 161/80 (BP Location: Left Arm, Patient Position: Sitting)   Pulse 77   Temp 97.6 F (36.4 C) (Temporal)   Resp 20   Ht 5' 1.25" (1.556 m)   Wt 171 lb 2 oz (77.6 kg)   LMP 05/25/1974   SpO2 97%   BMI 32.07 kg/m  Wt Readings from Last 3 Encounters:  06/05/20 171 lb 2 oz (77.6 kg)  05/07/20 168 lb (76.2 kg)  05/03/20 168 lb 8 oz (76.4 kg)

## 2020-06-05 NOTE — Progress Notes (Signed)
Radiation Oncology         [phone] ________________________________  Name: Alisha Delgado        MRN: [id number]  Date of Service: 06/05/2020 DOB: 08-26-1945  RK:[id number], Ronie Spies, MD  Leeroy Cha,*     REFERRING PHYSICIAN: Leeroy Cha,*   DIAGNOSIS: The encounter diagnosis was Ductal carcinoma in situ (DCIS) of left breast.   HISTORY OF PRESENT ILLNESS: Alisha Delgado is a 75 y.o. female originally seen in the multidisciplinary breast clinic for a new diagnosis of left breast cancer. The patient was noted to have screening detected calcifications in the left breast, she also has a family history of cancer of the breast.  Diagnostic imaging revealed a grouping of calcifications in the 9 o'clock position measuring 4.7 cm in greatest dimension.  She did undergo a biopsy on 04/09/20 which revealed a ER/PR positive high-grade DCIS.  Since her last visit she did undergo left lumpectomy on 05/07/2020 which revealed high-grade DCIS with central necrosis and calcifications measuring up to 2.1 cm.  DCIS was focally less than 1 mm from the superior and anterior margins.  She is seen today to discuss next steps of adjuvant treatment and has met with Dr. Ninfa Linden and does not plan to have additional surgery.   PREVIOUS RADIATION THERAPY: No   PAST MEDICAL HISTORY:  Past Medical History:  Diagnosis Date  . Anemia   . Arthritis   . Blood transfusion   . Breast cancer ([place])   . Cataract   . Diabetes mellitus   . Distal radius fracture, left 2015  . Family history of breast cancer 04/25/2020  . Family history of ovarian cancer 04/25/2020  . GERD (gastroesophageal reflux disease) 2012  . Headache    migraines  . Hypertension   . Osteopenia 2019  . Osteoporosis   . Osteoporosis 2019   At [facility]  . Pneumonia   . Proteinuria 2016  . Ribs, multiple fractures 2015  . Ulcer        PAST SURGICAL HISTORY: Past Surgical History:  Procedure Laterality Date  . ABDOMINAL  HYSTERECTOMY  1976   with oophorectomy  . APPENDECTOMY  1976  . BACK SURGERY    . BREAST LUMPECTOMY WITH RADIOACTIVE SEED LOCALIZATION Left 05/07/2020   Procedure: LEFT BREAST LUMPECTOMY WITH RADIOACTIVE SEED LOCALIZATION;  Surgeon: Coralie Keens, MD;  Location: [facility];  Service: General;  Laterality: Left;  . BREAST SURGERY Right 2016   Dr. Marlou Starks  . CYSTECTOMY Right 2016   Dr. Marlou Starks  . EYE SURGERY Bilateral 2014   Dr. Tommy Rainwater  . FRACTURE SURGERY Left 1995   Percell Miller and Noemi Chapel  . KNEE SURGERY    . [place] and 2007   Dr. Durene Cal  . TIBIA FRACTURE SURGERY Left 1995   as well as knee surgery patella and ACL were torn by Charyl Dancer     FAMILY HISTORY:  Family History  Problem Relation Age of Onset  . Ovarian cancer Mother 76  . Ovarian cancer Maternal Aunt 80  . Breast cancer Maternal Aunt 90  . Breast cancer Paternal Aunt        dx 61s  . Brain cancer Paternal Grandfather 38  . Ovarian cancer Cousin 67  . Ovarian cancer Cousin        dx 63s  . Breast cancer Cousin 59       maternal female cousin  . Breast cancer Cousin        maternal female cousin;  dx unknown age  . Breast cancer Cousin        maternal female cousin; dx unknown age     SOCIAL HISTORY:  reports that she quit smoking about 32 years ago. Her smoking use included cigarettes. She has a 1.00 pack-year smoking history. She has never used smokeless tobacco. She reports current alcohol use. She reports that she does not use drugs.  The patient is widowed and resides in [place].  She is in a relationship with Dr. Lenna Gilford and has been for over 20 years. She has three adult children.    ALLERGIES: Codeine, Atorvastatin, Other, and Pravastatin   MEDICATIONS:  Current Outpatient Medications  Medication Sig Dispense Refill  . alendronate (FOSAMAX) 70 MG tablet Take 70 mg by mouth every Monday. Take with a full glass of water on an empty stomach.    Marland Kitchen atorvastatin (LIPITOR) 20 MG tablet  Take 20 mg by mouth daily with supper.     . Calcium Carbonate-Vitamin D (CALCIUM 600 + D PO) Take 1 tablet by mouth in the morning and at bedtime.     . ferrous sulfate 325 (65 FE) MG tablet Take 325 mg by mouth daily with lunch.     . glimepiride (AMARYL) 4 MG tablet Take 4 mg by mouth daily with breakfast.    . Krill Oil 300 MG CAPS Take 300 mg by mouth at bedtime.     Marland Kitchen losartan-hydrochlorothiazide (HYZAAR) 100-12.5 MG tablet Take 1 tablet by mouth at bedtime.     . meloxicam (MOBIC) 15 MG tablet Take 15 mg by mouth daily as needed (hip pain.).     Marland Kitchen metFORMIN (GLUCOPHAGE) 500 MG tablet Take 2,000 mg by mouth daily after supper.    . Multiple Vitamin (MULTIVITAMIN WITH MINERALS) TABS tablet Take 1 tablet by mouth daily.    Marland Kitchen omeprazole (PRILOSEC) 20 MG capsule Take 20 mg by mouth daily as needed (acid reflux/indigestion.).     Marland Kitchen Polyethyl Glycol-Propyl Glycol (SYSTANE) 0.4-0.3 % SOLN Place 1 drop into both eyes 3 (three) times daily as needed (dry/irritated eyes).    . vitamin B-12 (CYANOCOBALAMIN) 100 MCG tablet Take 100 mcg by mouth daily with lunch.     . vitamin C (ASCORBIC ACID) 500 MG tablet Take 500 mg by mouth daily with lunch.     . rizatriptan (MAXALT) 10 MG tablet Take 10 mg by mouth 2 (two) times daily as needed for migraine.  (Patient not taking: Reported on 06/05/2020)     No current facility-administered medications for this encounter.     REVIEW OF SYSTEMS: On review of systems, the patient reports that she is doing well overall.  She recently fell and unfortunately sprained both of her wrists.  Fortunately she did not cause any injury to her breast.  She denies any concerns with postoperative healing.  No other complaints are verbalized.     PHYSICAL EXAM:  Wt Readings from Last 3 Encounters:  06/05/20 171 lb 2 oz (77.6 kg)  05/07/20 168 lb (76.2 kg)  05/03/20 168 lb 8 oz (76.4 kg)   Temp Readings from Last 3 Encounters:  06/05/20 97.6 F (36.4 C) (Temporal)   05/07/20 97.7 F (36.5 C)  05/03/20 98.5 F (36.9 C) (Oral)   BP Readings from Last 3 Encounters:  06/05/20 (!) 161/80  05/07/20 132/69  05/03/20 139/75   Pulse Readings from Last 3 Encounters:  06/05/20 77  05/07/20 (!) 56  05/03/20 75    In general this is a  well appearing caucasian  female in no acute distress. She's alert and oriented x4 and appropriate throughout the examination. Cardiopulmonary assessment is negative for acute distress and she exhibits normal effort. Bilateral breast exam is deferred.    ECOG = 0  0 - Asymptomatic (Fully active, able to carry on all predisease activities without restriction)  1 - Symptomatic but completely ambulatory (Restricted in physically strenuous activity but ambulatory and able to carry out work of a light or sedentary nature. For example, light housework, office work)  2 - Symptomatic, <50% in bed during the day (Ambulatory and capable of all self care but unable to carry out any work activities. Up and about more than 50% of waking hours)  3 - Symptomatic, >50% in bed, but not bedbound (Capable of only limited self-care, confined to bed or chair 50% or more of waking hours)  4 - Bedbound (Completely disabled. Cannot carry on any self-care. Totally confined to bed or chair)  5 - Death   Eustace Pen MM, Creech RH, Tormey DC, et al. [phone]). "Toxicity and response criteria of the [facility]". [place] Oncol. 5 (6): 649-55    LABORATORY DATA:  Lab Results  Component Value Date   WBC 7.3 05/03/2020   HGB 13.2 05/03/2020   HCT 39.7 05/03/2020   MCV 91.9 05/03/2020   PLT 325 05/03/2020   Lab Results  Component Value Date   NA 137 05/03/2020   K 3.5 05/03/2020   CL 98 05/03/2020   CO2 28 05/03/2020   Lab Results  Component Value Date   ALT 41 04/24/2020   AST 34 04/24/2020   ALKPHOS 85 04/24/2020   BILITOT 0.7 04/24/2020      RADIOGRAPHY: No results found.     IMPRESSION/PLAN: 1. 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  She will also follow-up with medical oncology regarding antiestrogen therapy at the conclusion of radiation.  We reviewed the risks, benefits, short and long-term effects of radiotherapy as well as the recommendation for 4 weeks of treatment.  She has signed written consent to proceed and will return tomorrow for simulation.  In a visit lasting 45 minutes, greater than 50% of the time was spent face to face with Dr. Lisbeth Renshaw and remotely with myself on WebEx reviewing her case, as well as in preparation of, discussing, and coordinating the patient's care.  The above documentation reflects my direct findings during this shared patient visit. Please see the separate note by Dr. Lisbeth Renshaw on this date for the remainder of the patient's plan of care.    Carola Rhine, PAC

## 2020-06-06 ENCOUNTER — Ambulatory Visit
Admission: RE | Admit: 2020-06-06 | Discharge: 2020-06-06 | Disposition: A | Discharge status: Home / Self Care | Payer: Medicare Other | Source: Ambulatory Visit | Attending: Radiation Oncology | Admitting: Radiation Oncology

## 2020-06-06 ENCOUNTER — Other Ambulatory Visit: Payer: Self-pay

## 2020-06-06 DIAGNOSIS — D0512 Intraductal carcinoma in situ of left breast: Secondary | ICD-10-CM | POA: Diagnosis not present

## 2020-06-06 DIAGNOSIS — Z510 Encounter for antineoplastic radiation therapy: Secondary | ICD-10-CM | POA: Insufficient documentation

## 2020-06-07 ENCOUNTER — Encounter: Payer: Self-pay | Admitting: *Deleted

## 2020-06-09 DIAGNOSIS — D0512 Intraductal carcinoma in situ of left breast: Secondary | ICD-10-CM | POA: Diagnosis not present

## 2020-06-09 DIAGNOSIS — Z510 Encounter for antineoplastic radiation therapy: Secondary | ICD-10-CM | POA: Diagnosis not present

## 2020-06-10 ENCOUNTER — Telehealth: Payer: Self-pay | Admitting: Oncology

## 2020-06-10 NOTE — Telephone Encounter (Signed)
Scheduled apt per 11/4 sch msg - pt is aware of appt date and time   

## 2020-06-13 ENCOUNTER — Ambulatory Visit
Admission: RE | Admit: 2020-06-13 | Discharge: 2020-06-13 | Disposition: A | Discharge status: Home / Self Care | Payer: Medicare Other | Source: Ambulatory Visit | Attending: Radiation Oncology | Admitting: Radiation Oncology

## 2020-06-13 DIAGNOSIS — Z510 Encounter for antineoplastic radiation therapy: Secondary | ICD-10-CM | POA: Diagnosis not present

## 2020-06-13 DIAGNOSIS — D0512 Intraductal carcinoma in situ of left breast: Secondary | ICD-10-CM | POA: Diagnosis not present

## 2020-06-13 NOTE — Progress Notes (Signed)
Pt here for patient teaching.  Pt given Radiation and You booklet, skin care instructions, Alra deodorant and Radiaplex gel.  Reviewed areas of pertinence such as fatigue, hair loss, skin changes, breast tenderness and breast swelling . Pt able to give teach back of to pat skin and use unscented/gentle soap,apply Radiaplex bid, avoid applying anything to skin within 4 hours of treatment, avoid wearing an under wire bra and to use an electric razor if they must shave. Pt verbalizes understanding of information given and will contact nursing with any questions or concerns.     Leianna Barga M. Giulietta Prokop RN, BSN      

## 2020-06-14 ENCOUNTER — Ambulatory Visit
Admission: RE | Admit: 2020-06-14 | Discharge: 2020-06-14 | Disposition: A | Discharge status: Home / Self Care | Payer: Medicare Other | Source: Ambulatory Visit | Attending: Radiation Oncology | Admitting: Radiation Oncology

## 2020-06-14 DIAGNOSIS — D0512 Intraductal carcinoma in situ of left breast: Secondary | ICD-10-CM | POA: Diagnosis not present

## 2020-06-14 DIAGNOSIS — Z510 Encounter for antineoplastic radiation therapy: Secondary | ICD-10-CM | POA: Diagnosis not present

## 2020-06-14 MED ORDER — ALRA NON-METALLIC DEODORANT (RAD-ONC)
1.0000 "application " | Freq: Once | TOPICAL | Status: AC
  Administered 2020-06-14: 1 via TOPICAL

## 2020-06-14 MED ORDER — RADIAPLEXRX EX GEL: Freq: Once | CUTANEOUS | Status: AC

## 2020-06-17 ENCOUNTER — Ambulatory Visit
Admission: RE | Admit: 2020-06-17 | Discharge: 2020-06-17 | Disposition: A | Discharge status: Home / Self Care | Payer: Medicare Other | Source: Ambulatory Visit | Attending: Radiation Oncology | Admitting: Radiation Oncology

## 2020-06-17 DIAGNOSIS — Z510 Encounter for antineoplastic radiation therapy: Secondary | ICD-10-CM | POA: Diagnosis not present

## 2020-06-17 DIAGNOSIS — D0512 Intraductal carcinoma in situ of left breast: Secondary | ICD-10-CM | POA: Diagnosis not present

## 2020-06-18 ENCOUNTER — Ambulatory Visit
Admission: RE | Admit: 2020-06-18 | Discharge: 2020-06-18 | Disposition: A | Discharge status: Home / Self Care | Payer: Medicare Other | Source: Ambulatory Visit | Attending: Radiation Oncology | Admitting: Radiation Oncology

## 2020-06-18 ENCOUNTER — Encounter: Payer: Self-pay | Admitting: Radiation Oncology

## 2020-06-18 ENCOUNTER — Other Ambulatory Visit: Payer: Self-pay

## 2020-06-18 DIAGNOSIS — Z510 Encounter for antineoplastic radiation therapy: Secondary | ICD-10-CM | POA: Diagnosis not present

## 2020-06-18 DIAGNOSIS — D0512 Intraductal carcinoma in situ of left breast: Secondary | ICD-10-CM | POA: Diagnosis not present

## 2020-06-19 ENCOUNTER — Ambulatory Visit
Admission: RE | Admit: 2020-06-19 | Discharge: 2020-06-19 | Disposition: A | Discharge status: Home / Self Care | Payer: Medicare Other | Source: Ambulatory Visit | Attending: Radiation Oncology | Admitting: Radiation Oncology

## 2020-06-19 DIAGNOSIS — D0512 Intraductal carcinoma in situ of left breast: Secondary | ICD-10-CM | POA: Diagnosis not present

## 2020-06-19 DIAGNOSIS — Z510 Encounter for antineoplastic radiation therapy: Secondary | ICD-10-CM | POA: Diagnosis not present

## 2020-06-20 ENCOUNTER — Other Ambulatory Visit: Payer: Self-pay

## 2020-06-20 ENCOUNTER — Ambulatory Visit
Admission: RE | Admit: 2020-06-20 | Discharge: 2020-06-20 | Disposition: A | Discharge status: Home / Self Care | Payer: Medicare Other | Source: Ambulatory Visit | Attending: Radiation Oncology | Admitting: Radiation Oncology

## 2020-06-20 DIAGNOSIS — D0512 Intraductal carcinoma in situ of left breast: Secondary | ICD-10-CM | POA: Diagnosis not present

## 2020-06-20 DIAGNOSIS — Z510 Encounter for antineoplastic radiation therapy: Secondary | ICD-10-CM | POA: Diagnosis not present

## 2020-06-20 NOTE — Progress Notes (Signed)
  Radiation Oncology         [phone] ________________________________  Name: Alisha Delgado MRN: [id number]  Date: 06/18/2020  DOB: 02-08-1946  SIMULATION NOTE   NARRATIVE:  The patient underwent simulation today for ongoing radiation therapy.  The existing CT study set was employed for the purpose of virtual treatment planning.  The target and avoidance structures were reviewed and modified as necessary.  Treatment planning then occurred.  The radiation boost prescription was entered and confirmed.  A total of 3 complex treatment devices were fabricated in the form of multi-leaf collimators to shape radiation around the targets while maximally excluding nearby normal structures. I have requested : Isodose Plan.    PLAN:  This modified radiation beam arrangement is intended to continue the current radiation dose to an additional 8 Gy in 4 fractions for a total cumulative dose of 50.56 Gy.    ------------------------------------------------  Jodelle Gross, MD, PhD

## 2020-06-21 ENCOUNTER — Ambulatory Visit
Admission: RE | Admit: 2020-06-21 | Discharge: 2020-06-21 | Disposition: A | Discharge status: Home / Self Care | Payer: Medicare Other | Source: Ambulatory Visit | Attending: Radiation Oncology | Admitting: Radiation Oncology

## 2020-06-21 ENCOUNTER — Other Ambulatory Visit: Payer: Self-pay

## 2020-06-21 DIAGNOSIS — Z510 Encounter for antineoplastic radiation therapy: Secondary | ICD-10-CM | POA: Diagnosis not present

## 2020-06-21 DIAGNOSIS — D0512 Intraductal carcinoma in situ of left breast: Secondary | ICD-10-CM | POA: Diagnosis not present

## 2020-06-24 ENCOUNTER — Other Ambulatory Visit: Payer: Self-pay

## 2020-06-24 ENCOUNTER — Ambulatory Visit
Admission: RE | Admit: 2020-06-24 | Discharge: 2020-06-24 | Disposition: A | Discharge status: Home / Self Care | Payer: Medicare Other | Source: Ambulatory Visit | Attending: Radiation Oncology | Admitting: Radiation Oncology

## 2020-06-24 DIAGNOSIS — Z510 Encounter for antineoplastic radiation therapy: Secondary | ICD-10-CM | POA: Diagnosis not present

## 2020-06-24 DIAGNOSIS — D0512 Intraductal carcinoma in situ of left breast: Secondary | ICD-10-CM | POA: Diagnosis not present

## 2020-06-25 ENCOUNTER — Ambulatory Visit
Admission: RE | Admit: 2020-06-25 | Discharge: 2020-06-25 | Disposition: A | Discharge status: Home / Self Care | Payer: Medicare Other | Source: Ambulatory Visit | Attending: Radiation Oncology | Admitting: Radiation Oncology

## 2020-06-25 DIAGNOSIS — D0512 Intraductal carcinoma in situ of left breast: Secondary | ICD-10-CM | POA: Insufficient documentation

## 2020-06-25 DIAGNOSIS — Z510 Encounter for antineoplastic radiation therapy: Secondary | ICD-10-CM | POA: Diagnosis not present

## 2020-06-26 ENCOUNTER — Other Ambulatory Visit: Payer: Self-pay

## 2020-06-26 ENCOUNTER — Ambulatory Visit
Admission: RE | Admit: 2020-06-26 | Discharge: 2020-06-26 | Disposition: A | Discharge status: Home / Self Care | Payer: Medicare Other | Source: Ambulatory Visit | Attending: Radiation Oncology | Admitting: Radiation Oncology

## 2020-06-26 DIAGNOSIS — D0512 Intraductal carcinoma in situ of left breast: Secondary | ICD-10-CM | POA: Diagnosis not present

## 2020-06-26 DIAGNOSIS — Z510 Encounter for antineoplastic radiation therapy: Secondary | ICD-10-CM | POA: Diagnosis not present

## 2020-06-27 ENCOUNTER — Ambulatory Visit
Admission: RE | Admit: 2020-06-27 | Discharge: 2020-06-27 | Disposition: A | Discharge status: Home / Self Care | Payer: Medicare Other | Source: Ambulatory Visit | Attending: Radiation Oncology | Admitting: Radiation Oncology

## 2020-06-27 DIAGNOSIS — D0512 Intraductal carcinoma in situ of left breast: Secondary | ICD-10-CM | POA: Diagnosis not present

## 2020-06-27 DIAGNOSIS — Z510 Encounter for antineoplastic radiation therapy: Secondary | ICD-10-CM | POA: Diagnosis not present

## 2020-06-28 ENCOUNTER — Ambulatory Visit
Admission: RE | Admit: 2020-06-28 | Discharge: 2020-06-28 | Disposition: A | Discharge status: Home / Self Care | Payer: Medicare Other | Source: Ambulatory Visit | Attending: Radiation Oncology | Admitting: Radiation Oncology

## 2020-06-28 ENCOUNTER — Ambulatory Visit: Payer: Medicare Other | Admitting: Radiation Oncology

## 2020-06-28 DIAGNOSIS — Z510 Encounter for antineoplastic radiation therapy: Secondary | ICD-10-CM | POA: Diagnosis not present

## 2020-06-28 DIAGNOSIS — D0512 Intraductal carcinoma in situ of left breast: Secondary | ICD-10-CM | POA: Diagnosis not present

## 2020-07-01 ENCOUNTER — Ambulatory Visit
Admission: RE | Admit: 2020-07-01 | Discharge: 2020-07-01 | Disposition: A | Discharge status: Home / Self Care | Payer: Medicare Other | Source: Ambulatory Visit | Attending: Radiation Oncology | Admitting: Radiation Oncology

## 2020-07-01 ENCOUNTER — Other Ambulatory Visit: Payer: Self-pay

## 2020-07-01 DIAGNOSIS — D0512 Intraductal carcinoma in situ of left breast: Secondary | ICD-10-CM | POA: Diagnosis not present

## 2020-07-01 DIAGNOSIS — Z510 Encounter for antineoplastic radiation therapy: Secondary | ICD-10-CM | POA: Diagnosis not present

## 2020-07-02 ENCOUNTER — Other Ambulatory Visit: Payer: Self-pay

## 2020-07-02 ENCOUNTER — Ambulatory Visit
Admission: RE | Admit: 2020-07-02 | Discharge: 2020-07-02 | Disposition: A | Discharge status: Home / Self Care | Payer: Medicare Other | Source: Ambulatory Visit | Attending: Radiation Oncology | Admitting: Radiation Oncology

## 2020-07-02 DIAGNOSIS — Z510 Encounter for antineoplastic radiation therapy: Secondary | ICD-10-CM | POA: Diagnosis not present

## 2020-07-02 DIAGNOSIS — D0512 Intraductal carcinoma in situ of left breast: Secondary | ICD-10-CM | POA: Diagnosis not present

## 2020-07-03 ENCOUNTER — Other Ambulatory Visit: Payer: Self-pay

## 2020-07-03 ENCOUNTER — Ambulatory Visit
Admission: RE | Admit: 2020-07-03 | Discharge: 2020-07-03 | Disposition: A | Discharge status: Home / Self Care | Payer: Medicare Other | Source: Ambulatory Visit | Attending: Radiation Oncology | Admitting: Radiation Oncology

## 2020-07-03 DIAGNOSIS — Z510 Encounter for antineoplastic radiation therapy: Secondary | ICD-10-CM | POA: Diagnosis not present

## 2020-07-03 DIAGNOSIS — D0512 Intraductal carcinoma in situ of left breast: Secondary | ICD-10-CM | POA: Diagnosis not present

## 2020-07-04 ENCOUNTER — Ambulatory Visit
Admission: RE | Admit: 2020-07-04 | Discharge: 2020-07-04 | Disposition: A | Discharge status: Home / Self Care | Payer: Medicare Other | Source: Ambulatory Visit | Attending: Radiation Oncology | Admitting: Radiation Oncology

## 2020-07-04 ENCOUNTER — Other Ambulatory Visit: Payer: Self-pay

## 2020-07-04 DIAGNOSIS — D0512 Intraductal carcinoma in situ of left breast: Secondary | ICD-10-CM | POA: Diagnosis not present

## 2020-07-04 DIAGNOSIS — Z510 Encounter for antineoplastic radiation therapy: Secondary | ICD-10-CM | POA: Diagnosis not present

## 2020-07-05 ENCOUNTER — Ambulatory Visit
Admission: RE | Admit: 2020-07-05 | Discharge: 2020-07-05 | Disposition: A | Discharge status: Home / Self Care | Payer: Medicare Other | Source: Ambulatory Visit | Attending: Radiation Oncology | Admitting: Radiation Oncology

## 2020-07-05 DIAGNOSIS — D0512 Intraductal carcinoma in situ of left breast: Secondary | ICD-10-CM | POA: Diagnosis not present

## 2020-07-05 DIAGNOSIS — Z510 Encounter for antineoplastic radiation therapy: Secondary | ICD-10-CM | POA: Diagnosis not present

## 2020-07-08 ENCOUNTER — Ambulatory Visit
Admission: RE | Admit: 2020-07-08 | Discharge: 2020-07-08 | Disposition: A | Discharge status: Home / Self Care | Payer: Medicare Other | Source: Ambulatory Visit | Attending: Radiation Oncology | Admitting: Radiation Oncology

## 2020-07-08 DIAGNOSIS — Z510 Encounter for antineoplastic radiation therapy: Secondary | ICD-10-CM | POA: Diagnosis not present

## 2020-07-08 DIAGNOSIS — D0512 Intraductal carcinoma in situ of left breast: Secondary | ICD-10-CM | POA: Diagnosis not present

## 2020-07-09 ENCOUNTER — Other Ambulatory Visit: Payer: Self-pay

## 2020-07-09 ENCOUNTER — Encounter: Payer: Self-pay | Admitting: Radiation Oncology

## 2020-07-09 ENCOUNTER — Encounter: Payer: Self-pay | Admitting: *Deleted

## 2020-07-09 ENCOUNTER — Ambulatory Visit
Admission: RE | Admit: 2020-07-09 | Discharge: 2020-07-09 | Disposition: A | Discharge status: Home / Self Care | Payer: Medicare Other | Source: Ambulatory Visit | Attending: Radiation Oncology | Admitting: Radiation Oncology

## 2020-07-09 DIAGNOSIS — Z510 Encounter for antineoplastic radiation therapy: Secondary | ICD-10-CM | POA: Diagnosis not present

## 2020-07-09 DIAGNOSIS — D0512 Intraductal carcinoma in situ of left breast: Secondary | ICD-10-CM | POA: Diagnosis not present

## 2020-07-10 ENCOUNTER — Other Ambulatory Visit: Payer: Self-pay

## 2020-07-10 ENCOUNTER — Ambulatory Visit
Admission: RE | Admit: 2020-07-10 | Discharge: 2020-07-10 | Disposition: A | Discharge status: Home / Self Care | Payer: Medicare Other | Source: Ambulatory Visit | Attending: Radiation Oncology | Admitting: Radiation Oncology

## 2020-07-10 DIAGNOSIS — Z510 Encounter for antineoplastic radiation therapy: Secondary | ICD-10-CM | POA: Diagnosis not present

## 2020-07-10 DIAGNOSIS — D0512 Intraductal carcinoma in situ of left breast: Secondary | ICD-10-CM | POA: Diagnosis not present

## 2020-07-11 NOTE — Progress Notes (Signed)
  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 She had expected skin changes and reported sensitivity of her skin during treatment.   Plan: The patient will receive a call in about one month from the radiation oncology department. She will continue follow up with Dr. Jana Hakim as well.   ________________________________________________    Carola Rhine, [facility]

## 2020-08-02 DIAGNOSIS — E669 Obesity, unspecified: Secondary | ICD-10-CM | POA: Diagnosis not present

## 2020-08-02 DIAGNOSIS — Z1389 Encounter for screening for other disorder: Secondary | ICD-10-CM | POA: Diagnosis not present

## 2020-08-02 DIAGNOSIS — D0512 Intraductal carcinoma in situ of left breast: Secondary | ICD-10-CM | POA: Diagnosis not present

## 2020-08-02 DIAGNOSIS — Z803 Family history of malignant neoplasm of breast: Secondary | ICD-10-CM | POA: Diagnosis not present

## 2020-08-02 DIAGNOSIS — E1169 Type 2 diabetes mellitus with other specified complication: Secondary | ICD-10-CM | POA: Diagnosis not present

## 2020-08-02 DIAGNOSIS — M810 Age-related osteoporosis without current pathological fracture: Secondary | ICD-10-CM | POA: Diagnosis not present

## 2020-08-02 DIAGNOSIS — D509 Iron deficiency anemia, unspecified: Secondary | ICD-10-CM | POA: Diagnosis not present

## 2020-08-02 DIAGNOSIS — Z7189 Other specified counseling: Secondary | ICD-10-CM | POA: Diagnosis not present

## 2020-08-02 DIAGNOSIS — Z8711 Personal history of peptic ulcer disease: Secondary | ICD-10-CM | POA: Diagnosis not present

## 2020-08-02 DIAGNOSIS — Z0000 Encounter for general adult medical examination without abnormal findings: Secondary | ICD-10-CM | POA: Diagnosis not present

## 2020-08-05 NOTE — Progress Notes (Signed)
[place]  [phone] [phone]     ID: Alisha Delgado DOB: 1946-01-16  MR#: [id number]  CSN#:[id number]  Patient Care Team: Leeroy Cha, MD as PCP - General (Internal Medicine) Mauro Kaufmann, RN as Oncology Nurse Navigator Rockwell Germany, RN as Oncology Nurse Navigator Coralie Keens, MD as Consulting Physician (General Surgery) Ardeth Repetto, Virgie Dad, MD as Consulting Physician (Oncology) Kyung Rudd, MD as Consulting Physician (Radiation Oncology) Wilford Corner, MD as Consulting Physician (Gastroenterology) Chauncey Cruel, MD OTHER MD:  CHIEF COMPLAINT: Ductal carcinoma in situ, estrogen receptor positive  CURRENT TREATMENT: Tamoxifen  INTERVAL HISTORY: Alisha Delgado returns today for follow up of her noninvasive breast cancer. She was evaluated in the multidisciplinary breast cancer clinic on 04/24/2020.  She underwent genetic testing during clinic. Results were negative with variants of uncertain significance in APC, POLD1, and RNF43.  She underwent left lumpectomy on 05/07/2020 under Dr. Ninfa Linden. Pathology from the procedure [id number]) showed: high grade ductal carcinoma in situ with central necrosis and calcifications, 2.1 cm; all margins negative.  She was referred back to Dr. Lisbeth Renshaw on 06/05/2020 to review radiation therapy. She subsequently received treatment from 06/13/2020 through 07/10/2020.   REVIEW OF SYSTEMS: Alisha Delgado had a fall early this winter and sprained her wrist and damaged one of her Achilles tendons.  She has only been able to start wearing shoes a couple of weeks ago.  She is starting to take walks.  She is doing some yard work.  The surgical breast is still sensitive and she occasionally has itching and "zingers".  A detailed review of systems today was otherwise stable   COVID 19 VACCINATION STATUS: fully vaccinated [employer]) with booster in September 2021   HISTORY OF CURRENT ILLNESS: From the original  intake note:  Alisha Delgado had routine screening mammography on 03/06/2020 showing calcifications in the left breast. She underwent left diagnostic mammography with tomography at [facility] on 03/25/2020 showing: breast density category C; 4.7 cm area of grouped calcifications in upper-inner left breast.  Accordingly on 04/09/2020 she proceeded to biopsy of the left breast area in question. The pathology from this procedure ([id number]) showed: ductal carcinoma in situ, high grade with necrosis and calcifications. Prognostic indicators significant for: estrogen receptor, 100% positive with strong staining intensity and progesterone receptor, 45% positive with moderate-strong staining intensity.   The patient's subsequent history is as detailed below.   PAST MEDICAL HISTORY: Past Medical History:  Diagnosis Date  . Anemia   . Arthritis   . Blood transfusion   . Breast cancer ([place])   . Cataract   . Diabetes mellitus   . Distal radius fracture, left 2015  . Family history of breast cancer 04/25/2020  . Family history of ovarian cancer 04/25/2020  . GERD (gastroesophageal reflux disease) 2012  . Headache    migraines  . Hypertension   . Osteopenia 2019  . Osteoporosis   . Osteoporosis 2019   At [facility]  . Pneumonia   . Proteinuria 2016  . Ribs, multiple fractures 2015  . Ulcer     PAST SURGICAL HISTORY: Past Surgical History:  Procedure Laterality Date  . ABDOMINAL HYSTERECTOMY  1976   with oophorectomy  . APPENDECTOMY  1976  . BACK SURGERY    . BREAST LUMPECTOMY WITH RADIOACTIVE SEED LOCALIZATION Left 05/07/2020   Procedure: LEFT BREAST LUMPECTOMY WITH RADIOACTIVE SEED LOCALIZATION;  Surgeon: Coralie Keens, MD;  Location: [facility];  Service: General;  Laterality: Left;  . BREAST SURGERY Right 2016  Dr. Marlou Starks  . CYSTECTOMY Right 2016   Dr. Marlou Starks  . EYE SURGERY Bilateral 2014   Dr. Tommy Rainwater  . FRACTURE SURGERY Left 1995   Percell Miller and Noemi Chapel  . KNEE SURGERY    . [place] and 2007   Dr. Durene Cal  . TIBIA FRACTURE SURGERY Left 1995   as well as knee surgery patella and ACL were torn by Charyl Dancer    FAMILY HISTORY: Family History  Problem Relation Age of Onset  . Ovarian cancer Mother 60  . Ovarian cancer Maternal Aunt 80  . Breast cancer Maternal Aunt 90  . Breast cancer Paternal Aunt        dx 18s  . Brain cancer Paternal Grandfather 18  . Ovarian cancer Cousin 16  . Ovarian cancer Cousin        dx 12s  . Breast cancer Cousin 33       maternal female cousin  . Breast cancer Cousin        maternal female cousin; dx unknown age  . Breast cancer Cousin        maternal female cousin; dx unknown age   Her mother died at age 5 from ovarian cancer, which was diagnosed at age 41. Her father died at age 43 from heart problems. Alisha Delgado has 2 brothers and 1 sister. In addition to her mother, a maternal aunt and two maternal cousins also had ovarian cancer. She also reports aunts on both sides of the family with breast cancer. Finally, she also notes brain cancer in her paternal grandfather at age 1.   GYNECOLOGIC HISTORY:  Patient's last menstrual period was 05/25/1974. Menarche: 75 years old GX P 0 LMP age 61, with hysterectomy Contraceptive: never used HRT: used following hysterectomy up until around age 62.  Hysterectomy? Yes, 1976 for endometriosis BSO? 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 She is widowed. Her current significant other, Dr. Teressa Lower, is a retired pulmonologist here in [place]. She lives by herself, HER-2 pet dogs having recently died 04/13/20).. 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 Smoking status: Former Smoker    Packs/day: 0.50    Years: 2.00    Pack years: 1.00    Types: Cigarettes    Quit date: 05/25/1988    Years since quitting: 32.2  . Smokeless tobacco: Never Used  Vaping Use  . Vaping Use: Never used  Substance Use Topics  . Alcohol use: Yes    Comment: Rarely special occasions  . Drug use: Never     Colonoscopy: 04/2011 (Dr. Michail Sermon)  PAP: date unknown, s/p hysterectomy   Bone density: 12/2018, osteopenia   Allergies  Allergen Reactions  . Codeine Itching    With High Dose  Codeine   -  Causes  Itching.  . Atorvastatin Other (See Comments)  . Other Other (See Comments)  .  Pravastatin Other (See Comments)    Current Outpatient Medications  Medication Sig Dispense Refill  . alendronate (FOSAMAX) 70 MG tablet Take 70 mg by mouth every Monday. Take with a full glass of water on an empty stomach.    Marland Kitchen atorvastatin (LIPITOR) 20 MG tablet Take 20 mg by mouth daily with supper.     . Calcium Carbonate-Vitamin D (CALCIUM 600 + D PO) Take 1 tablet by mouth in the morning and at bedtime.     . empagliflozin (JARDIANCE) 25 MG TABS tablet 1 tablet    . ferrous sulfate 325 (65 FE) MG tablet Take 325 mg by mouth daily with lunch.     . glimepiride (AMARYL) 4 MG tablet Take 4 mg by mouth daily with breakfast.    . Glucose Blood (ACCU-CHEK AVIVA PLUS VI) USE TO TEST ONCE A DAY AS DIRECTED    . glucose blood test strip See admin instructions.    Javier Docker Oil 300 MG CAPS Take 300 mg by mouth at bedtime.     . Lancets (ACCU-CHEK MULTICLIX) lancets See admin instructions.    Marland Kitchen losartan-hydrochlorothiazide (HYZAAR) 100-12.5 MG tablet Take 1 tablet by mouth at bedtime.     . meloxicam (MOBIC) 15 MG tablet Take 15 mg by mouth daily  as needed (hip pain.).     Marland Kitchen metFORMIN (GLUCOPHAGE) 500 MG tablet Take 2,000 mg by mouth daily after supper.    . Multiple Vitamin (MULTIVITAMIN WITH MINERALS) TABS tablet Take 1 tablet by mouth daily.    Marland Kitchen omeprazole (PRILOSEC) 20 MG capsule Take 20 mg by mouth daily as needed (acid reflux/indigestion.).     Marland Kitchen Polyethyl Glycol-Propyl Glycol (SYSTANE) 0.4-0.3 % SOLN Place 1 drop into both eyes 3 (three) times daily as needed (dry/irritated eyes).    . rizatriptan (MAXALT) 10 MG tablet Take 10 mg by mouth 2 (two) times daily as needed for migraine.  (Patient not taking: Reported on 06/05/2020)    . vitamin B-12 (CYANOCOBALAMIN) 100 MCG tablet Take 100 mcg by mouth daily with lunch.     . vitamin C (ASCORBIC ACID) 500 MG tablet Take 500 mg by mouth daily with lunch.      No current facility-administered medications for this visit.    OBJECTIVE: White woman who appears younger than stated age  26:   08/06/20 1028  BP: (!) 154/76  Pulse: 88  Resp: 18  Temp: 97.9 F (36.6 C)  SpO2: 98%     Body mass index is 32.87 kg/m.   Wt Readings from Last 3 Encounters:  08/06/20 175 lb 6.4 oz (79.6 kg)  06/05/20 171 lb 2 oz (77.6 kg)  05/07/20 168 lb (76.2 kg)      ECOG FS:1 - Symptomatic but completely ambulatory  Sclerae unicteric, EOMs intact Wearing a mask No cervical or supraclavicular adenopathy Lungs no rales or rhonchi Heart regular rate and rhythm Abd soft, nontender, positive bowel sounds MSK no focal spinal tenderness, no upper extremity lymphedema Neuro: nonfocal, well oriented, appropriate affect Breasts: The right breast is unremarkable.  The left breast is status post lumpectomy and radiation.  The cosmetic result is excellent.  There is no evidence of residual or recurrent disease.  Both axillae are benign   LAB RESULTS:  CMP     Component Value Date/Time   NA 137 05/03/2020 1430   K 3.5 05/03/2020 1430   CL 98 05/03/2020 1430   CO2 28 05/03/2020 1430   GLUCOSE  138 (H) 05/03/2020 1430  BUN 18 05/03/2020 1430   CREATININE 0.73 05/03/2020 1430   CREATININE 0.82 04/24/2020 1223   CALCIUM 9.9 05/03/2020 1430   PROT 7.1 04/24/2020 1223   ALBUMIN 3.7 04/24/2020 1223   AST 34 04/24/2020 1223   ALT 41 04/24/2020 1223   ALKPHOS 85 04/24/2020 1223   BILITOT 0.7 04/24/2020 1223   GFRNONAA >60 05/03/2020 1430   GFRNONAA >60 04/24/2020 1223    Lab Results  Component Value Date   TOTALPROTELP 6.8 06/05/2011   ALBUMINELP 60.0 06/05/2011   A1GS 5.0 (H) 06/05/2011   A2GS 11.7 06/05/2011   BETS 7.8 (H) 06/05/2011   BETA2SER 4.1 06/05/2011   GAMS 11.4 06/05/2011   MSPIKE NOT DET 06/05/2011   SPEI * 06/05/2011    Lab Results  Component Value Date   WBC 7.3 05/03/2020   NEUTROABS 4.5 04/24/2020   HGB 13.2 05/03/2020   HCT 39.7 05/03/2020   MCV 91.9 05/03/2020   PLT 325 05/03/2020    No results found for: LABCA2  No components found for: [id number]  No results for input(s): INR in the last 168 hours.  No results found for: LABCA2  No results found for: WUJ811  No results found for: BJY782  No results found for: NFA213  No results found for: CA2729  No components found for: HGQUANT  No results found for: CEA1 / No results found for: CEA1   No results found for: AFPTUMOR  No results found for: CHROMOGRNA  No results found for: KPAFRELGTCHN, LAMBDASER, KAPLAMBRATIO (kappa/lambda light chains)  No results found for: HGBA, HGBA2QUANT, HGBFQUANT, HGBSQUAN (Hemoglobinopathy evaluation)   No results found for: LDH  Lab Results  Component Value Date   IRON 36 (L) 07/21/2011   TIBC 310 07/21/2011   IRONPCTSAT 12 (L) 07/21/2011   (Iron and TIBC)  Lab Results  Component Value Date   FERRITIN 66 07/21/2011    Urinalysis    Component Value Date/Time   LABSPEC 1.025 06/05/2011 1142   PHURINE 6.0 06/05/2011 1142   HGBUR Negative 06/05/2011 1142   BILIRUBINUR Negative 06/05/2011 1142   KETONESUR Negative 06/05/2011 1142    PROTEINUR < 30 06/05/2011 1142   NITRITE Negative 06/05/2011 1142   LEUKOCYTESUR Negative 06/05/2011 1142    STUDIES: No results found.   ELIGIBLE FOR AVAILABLE RESEARCH PROTOCOL: AET  ASSESSMENT: 75 y.o. [place] woman status post left breast biopsy 04/09/2020 for ductal carcinoma in situ, high-grade, estrogen and progesterone receptor positive  (1) status post left lumpectomy with no sentinel lymph node sampling 05/07/2020 for a 2.1 cm ductal carcinoma in situ, high-grade, with negative margins  (2) genetics testing 05/04/2020 through the [facility] Common Hereditary Cancers Panel offered by [facility] found no deleterious mutations in APC, ATM, AXIN2, BARD1, BMPR1A, BRCA1, BRCA2, BRIP1, CDH1, CDK4, CDKN2A (p14ARF), CDKN2A (p16INK4a), CHEK2, CTNNA1, DICER1, EPCAM (Deletion/duplication testing only), GREM1 (promoter region deletion/duplication testing only), KIT, MEN1, MLH1, MSH2, MSH3, MSH6, MUTYH, NBN, NF1, NHTL1, PALB2, PDGFRA, PMS2, POLD1, POLE, PTEN, RAD50, RAD51C, RAD51D, RNF43, SDHB, SDHC, SDHD, SMAD4, SMARCA4. STK11, TP53, TSC1, TSC2, and VHL.  The following genes were evaluated for sequence changes only: SDHA and HOXB13 c.251G>A variant only.  (a) Variants of uncertain significance detected in APC c.6694C>G (p.His2232Asp), POLD1 c.632G>A (p.Arg211His), and RNF43 c.667C>T (p.Arg223Cys).    (3) adjuvant radiation 06/13/2020 through 07/10/2020 Site Technique Total Dose (Gy) Dose per Fx (Gy) Completed Fx Beam Energies  Breast, Left: Breast_Lt 3D 42.56/42.56 2.66 16/16 6X, 10X  Breast, Left: Breast_Lt_Bst 3D 8/8 2 4/4 6X, 10X    (  4) tamoxifen started 08/07/2018 to  (a) bone density at [facility] 01/06/2019 showed a T score of -2.3  (b) s/p remote hysterectomy   PLAN: Baker Janus has completed local therapy for her breast cancer.  She is now ready to consider antiestrogens. We discussed the difference between tamoxifen and anastrozole in detail. She understands that anastrozole and the aromatase  inhibitors in general work by blocking estrogen production. Accordingly vaginal dryness, decrease in bone density, and of course hot flashes can result. The aromatase inhibitors can also negatively affect the cholesterol profile, although that is a minor effect. One out of 5 women on aromatase inhibitors we will feel "old and achy". This arthralgia/myalgia syndrome, which resembles fibromyalgia clinically, does resolve with stopping the medications. Accordingly this is not a reason to not try an aromatase inhibitor but it is a frequent reason to stop it (in other words 20% of women will not be able to tolerate these medications).  Tamoxifen on the other hand does not block estrogen production. It does not "take away a woman's estrogen". It blocks the estrogen receptor in breast cells. Like anastrozole, it can also cause hot flashes. As opposed to anastrozole, tamoxifen has many estrogen-like effects. It is technically an estrogen receptor modulator. This means that in some tissues tamoxifen works like estrogen-- for example it helps strengthen the bones. It tends to improve the cholesterol profile. It can cause thickening of the endometrial lining, and even endometrial polyps or rarely cancer of the uterus.(The risk of uterine cancer due to tamoxifen is one additional cancer per thousand women year). It can cause vaginal wetness or stickiness. It can cause blood clots through this estrogen-like effect--the risk of blood clots with tamoxifen is exactly the same as with birth control pills or hormone replacement.  Neither of these agents causes mood changes or weight gain, despite the popular belief that they can have these side effects. We have data from studies comparing either of these drugs with placebo, and in those cases the control group had the same amount of weight gain and depression as the group that took the drug.  Baker Janus prefers tamoxifen because she took estrogens in the past with no clotting issues  and is status post hysterectomy.  And also because of the history of osteopenia.  We are starting that now.  I will catch up with her in a virtual visit in May, if she tolerates tamoxifen well the plan will be to continue that for a total of 5 years.  She likely will see Dr. Ninfa Linden in September and see [place] in March 2023  Total encounter time 25 minutes.Sarajane Jews C. Emmerie Battaglia, MD 08/06/2020 10:32 AM Medical Oncology and Hematology [facility] [place], [place] [address] Tel. [phone]    Fax. [phone]   This document serves as a record of services personally performed by Lurline Del, MD. It was created on his behalf by Wilburn Mylar, a trained medical scribe. The creation of this record is based on the scribe's personal observations and the provider's statements to them.   I, Lurline Del MD, have reviewed the above documentation for accuracy and completeness, and I agree with the above.   *Total Encounter Time as defined by the Centers for Medicare and Medicaid Services includes, in addition to the face-to-face time of a patient visit (documented in the note above) non-face-to-face time: obtaining and reviewing outside history, ordering and reviewing medications, tests or procedures, care coordination (communications with other health care  professionals or caregivers) and documentation in the medical record.

## 2020-08-06 ENCOUNTER — Other Ambulatory Visit: Payer: Self-pay

## 2020-08-06 ENCOUNTER — Inpatient Hospital Stay: Payer: Medicare Other | Attending: Oncology | Admitting: Oncology

## 2020-08-06 VITALS — BP 154/76 | HR 88 | Temp 97.9°F | Resp 18 | Ht 61.25 in | Wt 175.4 lb

## 2020-08-06 DIAGNOSIS — Z803 Family history of malignant neoplasm of breast: Secondary | ICD-10-CM | POA: Insufficient documentation

## 2020-08-06 DIAGNOSIS — Z170 Estrogen receptor positive status [ER+]: Secondary | ICD-10-CM | POA: Insufficient documentation

## 2020-08-06 DIAGNOSIS — Z79899 Other long term (current) drug therapy: Secondary | ICD-10-CM | POA: Insufficient documentation

## 2020-08-06 DIAGNOSIS — D0512 Intraductal carcinoma in situ of left breast: Secondary | ICD-10-CM | POA: Diagnosis not present

## 2020-08-06 DIAGNOSIS — Z90710 Acquired absence of both cervix and uterus: Secondary | ICD-10-CM | POA: Insufficient documentation

## 2020-08-06 DIAGNOSIS — Z90721 Acquired absence of ovaries, unilateral: Secondary | ICD-10-CM | POA: Insufficient documentation

## 2020-08-06 DIAGNOSIS — I10 Essential (primary) hypertension: Secondary | ICD-10-CM | POA: Insufficient documentation

## 2020-08-06 DIAGNOSIS — Z8041 Family history of malignant neoplasm of ovary: Secondary | ICD-10-CM | POA: Diagnosis not present

## 2020-08-06 DIAGNOSIS — Z79811 Long term (current) use of aromatase inhibitors: Secondary | ICD-10-CM | POA: Insufficient documentation

## 2020-08-06 DIAGNOSIS — Z87891 Personal history of nicotine dependence: Secondary | ICD-10-CM | POA: Insufficient documentation

## 2020-08-06 DIAGNOSIS — Z7984 Long term (current) use of oral hypoglycemic drugs: Secondary | ICD-10-CM | POA: Insufficient documentation

## 2020-08-06 DIAGNOSIS — E119 Type 2 diabetes mellitus without complications: Secondary | ICD-10-CM | POA: Insufficient documentation

## 2020-08-06 MED ORDER — TAMOXIFEN CITRATE 20 MG PO TABS: 20.0000 mg | ORAL_TABLET | Freq: Every day | ORAL | 4 refills | Status: DC

## 2020-08-07 ENCOUNTER — Telehealth: Payer: Self-pay | Admitting: Oncology

## 2020-08-07 NOTE — Telephone Encounter (Signed)
Scheduled appointment per 3/15 los. Spoke to patient who is aware of appointment date and time.

## 2020-08-19 ENCOUNTER — Other Ambulatory Visit: Payer: Self-pay

## 2020-08-19 ENCOUNTER — Ambulatory Visit
Admission: RE | Admit: 2020-08-19 | Discharge: 2020-08-19 | Disposition: A | Discharge status: Home / Self Care | Payer: Medicare Other | Source: Ambulatory Visit | Attending: Radiation Oncology | Admitting: Radiation Oncology

## 2020-08-19 DIAGNOSIS — Z510 Encounter for antineoplastic radiation therapy: Secondary | ICD-10-CM | POA: Insufficient documentation

## 2020-08-19 DIAGNOSIS — D0512 Intraductal carcinoma in situ of left breast: Secondary | ICD-10-CM | POA: Insufficient documentation

## 2020-08-19 NOTE — Progress Notes (Signed)
One month post treatment call was made today. I was unable to reach patient. Left a voicemail message advising patient that this was a follow-up to see how things are going since radiation treatment. Advised that if there are no issues then no call back is needed. However, if patient is having issues from treatment to please give radiation oncology a call.

## 2020-09-25 ENCOUNTER — Inpatient Hospital Stay: Payer: Medicare Other | Attending: Oncology | Admitting: Oncology

## 2020-09-25 DIAGNOSIS — D0512 Intraductal carcinoma in situ of left breast: Secondary | ICD-10-CM

## 2020-09-25 NOTE — Progress Notes (Signed)
[place]  [phone] [phone]     ID: NANEA JARED DOB: 1945/07/03  MR#: [id number]  EXN#:[id number]  Patient Care Team: Leeroy Cha, MD as PCP - General (Internal Medicine) Mauro Kaufmann, RN as Oncology Nurse Navigator Rockwell Germany, RN as Oncology Nurse Navigator Coralie Keens, MD as Consulting Physician (General Surgery) Bobbyjo Marulanda, Virgie Dad, MD as Consulting Physician (Oncology) Kyung Rudd, MD as Consulting Physician (Radiation Oncology) Wilford Corner, MD as Consulting Physician (Gastroenterology) Chauncey Cruel, MD OTHER MD:  I connected with Wilfred Curtis Tolley on 09/25/20 at  3:30 PM EDT by telephone visit and verified that I am speaking with the correct person using two identifiers.   I discussed the limitations, risks, security and privacy concerns of performing an evaluation and management service by telemedicine and the availability of in-person appointments. I also discussed with the patient that there may be a patient responsible charge related to this service. The patient expressed understanding and agreed to proceed.   Other persons participating in the visit and their role in the encounter: None  Patient's location: Home Provider's location: [place]  Total time spent: 15 min   CHIEF COMPLAINT: Ductal carcinoma in situ, estrogen receptor positive  CURRENT TREATMENT: Tamoxifen   INTERVAL HISTORY: Darrielle was contacted today for follow up of her noninvasive breast cancer.  She was started on tamoxifen at her last visit on 08/06/2020.  She is tolerating this well.  She has had 1 hot flash she says.  She has had some nights when she wakes up feeling a little bit wet but this is not every night.  She had some headaches initially which have resolved.  She has also had a few leg cramps and has been started under her husband's instructions on tonic water in the evening and that has helped.   REVIEW  OF SYSTEMS: Raiden is exercising chiefly by walking.  She is getting a new puppy, she has not yet decided which breed.  Otherwise she is doing "really well" and a detailed review of systems was benign   COVID 19 VACCINATION STATUS: fully vaccinated [employer]) with booster in September 2021   HISTORY OF CURRENT ILLNESS: From the original intake note:  Jazmina L Rosado had routine screening mammography on 03/06/2020 showing calcifications in the left breast. She underwent left diagnostic mammography with tomography at [facility] on 03/25/2020 showing: breast density category C; 4.7 cm area of grouped calcifications in upper-inner left breast.  Accordingly on 04/09/2020 she proceeded to biopsy of the left breast area in question. The pathology from this procedure ([id number]) showed: ductal carcinoma in situ, high grade with necrosis and calcifications. Prognostic indicators significant for: estrogen receptor, 100% positive with strong staining intensity and progesterone receptor, 45% positive with moderate-strong staining intensity.   The patient's subsequent history is as detailed below.   PAST MEDICAL HISTORY: Past Medical History:  Diagnosis Date  . Anemia   . Arthritis   . Blood transfusion   . Breast cancer ([place])   . Cataract   . Diabetes mellitus   . Distal radius fracture, left 2015  . Family history of breast cancer 04/25/2020  . Family history of ovarian cancer 04/25/2020  . GERD (gastroesophageal reflux disease) 2012  . Headache    migraines  . Hypertension   . Osteopenia 2019  . Osteoporosis   . Osteoporosis 2019   At [facility]  . Pneumonia   . Proteinuria 2016  . Ribs, multiple fractures 2015  .  Ulcer     PAST SURGICAL HISTORY: Past Surgical History:  Procedure Laterality Date  . ABDOMINAL HYSTERECTOMY  1976   with oophorectomy  . APPENDECTOMY  1976  . BACK SURGERY    . BREAST LUMPECTOMY WITH RADIOACTIVE SEED LOCALIZATION Left 05/07/2020   Procedure: LEFT BREAST LUMPECTOMY  WITH RADIOACTIVE SEED LOCALIZATION;  Surgeon: Coralie Keens, MD;  Location: [place];  Service: General;  Laterality: Left;  . BREAST SURGERY Right 2016   Dr. Marlou Starks  . CYSTECTOMY Right 2016   Dr. Marlou Starks  . EYE SURGERY Bilateral 2014   Dr. Tommy Rainwater  . FRACTURE SURGERY Left 1995   Percell Miller and Noemi Chapel  . KNEE SURGERY    . [place] and 2007   Dr. Durene Cal  . TIBIA FRACTURE SURGERY Left 1995   as well as knee surgery patella and ACL were torn by Charyl Dancer    FAMILY HISTORY: Family History  Problem Relation Age of Onset  . Ovarian cancer Mother 43  . Ovarian cancer Maternal Aunt 80  . Breast cancer Maternal Aunt 90  . Breast cancer Paternal Aunt        dx 60s  . Brain cancer Paternal Grandfather 2  . Ovarian cancer Cousin 46  . Ovarian cancer Cousin        dx 54s  . Breast cancer Cousin 17       maternal female cousin  . Breast cancer Cousin        maternal female cousin; dx unknown age  . Breast cancer Cousin        maternal female cousin; dx unknown age   Her mother died at age 40 from ovarian cancer, which was diagnosed at age 17. Her father died at age 34 from heart problems. Marketa has 2 brothers and 1 sister. In addition to her mother, a maternal aunt and two maternal cousins also had ovarian cancer. She also reports aunts on both sides of the family with breast cancer. Finally, she also notes brain cancer in her paternal grandfather at age 3.   GYNECOLOGIC HISTORY:  Patient's last menstrual period was 05/25/1974. Menarche: 75 years old GX P 0 LMP age 46, with hysterectomy Contraceptive: never used HRT: used following hysterectomy up until around age 80.  Hysterectomy? Yes, 1976 for endometriosis BSO? 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 She is widowed. Her current significant other, Dr. Teressa Lower, is a retired pulmonologist here in [place]. She  lives by herself, HER-2 pet dogs having recently died 04-15-2020).. She has three adopted children: Harrell Gave, age 49, is a Programmer, multimedia with an Geophysicist/field seismologist firm in [place], [place]; Valdez, age 76, works in pharmacy with [employer] here in [place]; [place] "Langston", age 28, owns an E-sports bar and cafe in [place], [place]. 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 Smoking status: Former Smoker    Packs/day: 0.50    Years: 2.00    Pack years: 1.00    Types: Cigarettes    Quit date: 05/25/1988    Years since quitting: 32.3  . Smokeless tobacco: Never Used  Vaping Use  . Vaping Use: Never used  Substance Use Topics  . Alcohol use: Yes    Comment: Rarely special occasions  . Drug use: Never     Colonoscopy: 04/2011 (Dr. Bosie Clos)  PAP: date unknown, s/p hysterectomy   Bone density: 12/2018, osteopenia   Allergies  Allergen Reactions  . Codeine Itching    With High Dose  Codeine   -  Causes  Itching.  . Atorvastatin Other (See Comments)  . Other Other (See Comments)  . Pravastatin Other (See Comments)    Current Outpatient Medications  Medication Sig Dispense Refill  . alendronate (FOSAMAX) 70 MG tablet Take 70 mg by mouth every Monday. Take with a full glass of water on an empty stomach.    Marland Kitchen atorvastatin (LIPITOR) 20 MG tablet Take 20 mg by mouth daily with supper.     . Calcium Carbonate-Vitamin D (CALCIUM 600 + D PO) Take 1 tablet by mouth in the morning and at bedtime.     . ferrous sulfate 325 (65 FE) MG tablet Take 325 mg by mouth daily with lunch.     . glimepiride (AMARYL) 4 MG tablet Take 4 mg by mouth daily with breakfast.    . Glucose Blood (ACCU-CHEK AVIVA PLUS VI) USE TO TEST ONCE A DAY AS DIRECTED    . glucose blood test strip See admin instructions.    Boris Lown Oil 300 MG CAPS Take 300 mg by mouth at bedtime.     . Lancets (ACCU-CHEK MULTICLIX) lancets See admin instructions.    Marland Kitchen losartan-hydrochlorothiazide (HYZAAR) 100-12.5 MG tablet Take 1 tablet by mouth at bedtime.     . meloxicam (MOBIC) 15 MG tablet Take 15 mg by mouth daily as needed (hip pain.).     Marland Kitchen metFORMIN (GLUCOPHAGE) 500 MG tablet Take 2,000 mg by mouth daily after supper.    . Multiple Vitamin (MULTIVITAMIN WITH MINERALS) TABS tablet Take 1 tablet by mouth daily.    Marland Kitchen omeprazole (PRILOSEC) 20 MG capsule Take 20 mg by mouth daily as needed (acid reflux/indigestion.).     Marland Kitchen Polyethyl Glycol-Propyl Glycol (SYSTANE) 0.4-0.3 % SOLN Place 1 drop into both eyes 3 (three) times daily as needed (dry/irritated eyes).    . rizatriptan (MAXALT) 10 MG tablet Take 10 mg by mouth 2 (two) times daily as needed for migraine.  (Patient not taking: Reported on 06/05/2020)    . vitamin B-12 (CYANOCOBALAMIN) 100 MCG tablet Take 100 mcg by mouth daily with lunch.     . vitamin C (ASCORBIC ACID) 500 MG tablet Take 500 mg by mouth daily with lunch.      No current facility-administered medications for this visit.    OBJECTIVE: White woman who appears younger than stated age  There were no vitals filed for this visit.   There is no height or weight on file to calculate BMI.   Wt Readings from Last 3 Encounters:  08/06/20 175 lb 6.4 oz (79.6 kg)  06/05/20 171 lb 2 oz (77.6 kg)  05/07/20 168 lb (76.2 kg)      ECOG FS:1 - Symptomatic but completely ambulatory  Telemedicine visit 09/25/2020  LAB RESULTS:  CMP     Component Value Date/Time   NA 137 05/03/2020 1430   K 3.5 05/03/2020 1430   CL 98 05/03/2020 1430   CO2 28 05/03/2020 1430   GLUCOSE 138 (H) 05/03/2020 1430   BUN 18 05/03/2020 1430   CREATININE 0.73 05/03/2020 1430   CREATININE 0.82 04/24/2020 1223   CALCIUM 9.9 05/03/2020 1430  PROT 7.1 04/24/2020 1223   ALBUMIN 3.7 04/24/2020 1223   AST 34 04/24/2020 1223   ALT 41  04/24/2020 1223   ALKPHOS 85 04/24/2020 1223   BILITOT 0.7 04/24/2020 1223   GFRNONAA >60 05/03/2020 1430   GFRNONAA >60 04/24/2020 1223    Lab Results  Component Value Date   TOTALPROTELP 6.8 06/05/2011   ALBUMINELP 60.0 06/05/2011   A1GS 5.0 (H) 06/05/2011   A2GS 11.7 06/05/2011   BETS 7.8 (H) 06/05/2011   BETA2SER 4.1 06/05/2011   GAMS 11.4 06/05/2011   MSPIKE NOT DET 06/05/2011   SPEI * 06/05/2011    Lab Results  Component Value Date   WBC 7.3 05/03/2020   NEUTROABS 4.5 04/24/2020   HGB 13.2 05/03/2020   HCT 39.7 05/03/2020   MCV 91.9 05/03/2020   PLT 325 05/03/2020    No results found for: LABCA2  No components found for: [id number]  No results for input(s): INR in the last 168 hours.  No results found for: LABCA2  No results found for: SKA768  No results found for: TLX726  No results found for: OMB559  No results found for: CA2729  No components found for: HGQUANT  No results found for: CEA1 / No results found for: CEA1   No results found for: AFPTUMOR  No results found for: CHROMOGRNA  No results found for: KPAFRELGTCHN, LAMBDASER, KAPLAMBRATIO (kappa/lambda light chains)  No results found for: HGBA, HGBA2QUANT, HGBFQUANT, HGBSQUAN (Hemoglobinopathy evaluation)   No results found for: LDH  Lab Results  Component Value Date   IRON 36 (L) 07/21/2011   TIBC 310 07/21/2011   IRONPCTSAT 12 (L) 07/21/2011   (Iron and TIBC)  Lab Results  Component Value Date   FERRITIN 66 07/21/2011    Urinalysis    Component Value Date/Time   LABSPEC 1.025 06/05/2011 1142   PHURINE 6.0 06/05/2011 1142   HGBUR Negative 06/05/2011 1142   BILIRUBINUR Negative 06/05/2011 1142   KETONESUR Negative 06/05/2011 1142   PROTEINUR < 30 06/05/2011 1142   NITRITE Negative 06/05/2011 1142   LEUKOCYTESUR Negative 06/05/2011 1142    STUDIES: No results found.   ELIGIBLE FOR AVAILABLE RESEARCH PROTOCOL: AET  ASSESSMENT: 75 y.o. [place] woman status post  left breast biopsy 04/09/2020 for ductal carcinoma in situ, high-grade, estrogen and progesterone receptor positive  (1) status post left lumpectomy with no sentinel lymph node sampling 05/07/2020 for a 2.1 cm ductal carcinoma in situ, high-grade, with negative margins  (2) genetics testing 05/04/2020 through the [facility] Common Hereditary Cancers Panel offered by [facility] found no deleterious mutations in APC, ATM, AXIN2, BARD1, BMPR1A, BRCA1, BRCA2, BRIP1, CDH1, CDK4, CDKN2A (p14ARF), CDKN2A (p16INK4a), CHEK2, CTNNA1, DICER1, EPCAM (Deletion/duplication testing only), GREM1 (promoter region deletion/duplication testing only), KIT, MEN1, MLH1, MSH2, MSH3, MSH6, MUTYH, NBN, NF1, NHTL1, PALB2, PDGFRA, PMS2, POLD1, POLE, PTEN, RAD50, RAD51C, RAD51D, RNF43, SDHB, SDHC, SDHD, SMAD4, SMARCA4. STK11, TP53, TSC1, TSC2, and VHL.  The following genes were evaluated for sequence changes only: SDHA and HOXB13 c.251G>A variant only.  (a) Variants of uncertain significance detected in APC c.6694C>G (p.His2232Asp), POLD1 c.632G>A (p.Arg211His), and RNF43 c.667C>T (p.Arg223Cys).    (3) adjuvant radiation 06/13/2020 through 07/10/2020 Site Technique Total Dose (Gy) Dose per Fx (Gy) Completed Fx Beam Energies  Breast, Left: Breast_Lt 3D 42.56/42.56 2.66 16/16 6X, 10X  Breast, Left: Breast_Lt_Bst 3D 8/8 2 4/4 6X, 10X    (4) tamoxifen started 08/07/2018 to  (a) bone density at [facility] 01/06/2019 showed a T score of -2.3  (b) s/p  remote hysterectomy   PLAN: Baker Janus is now half a year out from definitive surgery for her breast cancer with no evidence of disease recurrence.  This is favorable.  She is tolerating tamoxifen well.  The plan will be to continue that a total of 5 years.  If she continues to have night sweats and her bothersome enough we can certainly start gabapentin but at this point she does not want to add another medication  I have set her up for mammography in August and to see me in September.  From that  point we will see her on a once a year basis  She knows to call for any other issue that may develop before then     Sarajane Jews C. Sri Clegg, MD 09/25/2020 3:53 PM Medical Oncology and Hematology [facility] [place], [place] [address] Tel. [phone]    Fax. [phone]   This document serves as a record of services personally performed by Lurline Del, MD. It was created on his behalf by Wilburn Mylar, a trained medical scribe. The creation of this record is based on the scribe's personal observations and the provider's statements to them.   I, Lurline Del MD, have reviewed the above documentation for accuracy and completeness, and I agree with the above.   *Total Encounter Time as defined by the Centers for Medicare and Medicaid Services includes, in addition to the face-to-face time of a patient visit (documented in the note above) non-face-to-face time: obtaining and reviewing outside history, ordering and reviewing medications, tests or procedures, care coordination (communications with other health care professionals or caregivers) and documentation in the medical record.

## 2020-10-01 ENCOUNTER — Telehealth: Payer: Self-pay | Admitting: Oncology

## 2020-10-01 NOTE — Telephone Encounter (Signed)
Sch per 5/4 los, pt aware.

## 2020-11-05 DIAGNOSIS — D0512 Intraductal carcinoma in situ of left breast: Secondary | ICD-10-CM | POA: Diagnosis not present

## 2020-11-05 DIAGNOSIS — E785 Hyperlipidemia, unspecified: Secondary | ICD-10-CM | POA: Diagnosis not present

## 2020-11-05 DIAGNOSIS — E1165 Type 2 diabetes mellitus with hyperglycemia: Secondary | ICD-10-CM | POA: Diagnosis not present

## 2020-11-05 DIAGNOSIS — E1169 Type 2 diabetes mellitus with other specified complication: Secondary | ICD-10-CM | POA: Diagnosis not present

## 2020-11-05 DIAGNOSIS — M1610 Unilateral primary osteoarthritis, unspecified hip: Secondary | ICD-10-CM | POA: Diagnosis not present

## 2020-11-05 DIAGNOSIS — D509 Iron deficiency anemia, unspecified: Secondary | ICD-10-CM | POA: Diagnosis not present

## 2020-11-05 DIAGNOSIS — M1612 Unilateral primary osteoarthritis, left hip: Secondary | ICD-10-CM | POA: Diagnosis not present

## 2020-11-05 DIAGNOSIS — M810 Age-related osteoporosis without current pathological fracture: Secondary | ICD-10-CM | POA: Diagnosis not present

## 2020-11-05 DIAGNOSIS — I10 Essential (primary) hypertension: Secondary | ICD-10-CM | POA: Diagnosis not present

## 2020-11-20 ENCOUNTER — Other Ambulatory Visit: Payer: Self-pay | Admitting: Oncology

## 2020-11-20 DIAGNOSIS — R921 Mammographic calcification found on diagnostic imaging of breast: Secondary | ICD-10-CM | POA: Diagnosis not present

## 2020-11-20 DIAGNOSIS — Z853 Personal history of malignant neoplasm of breast: Secondary | ICD-10-CM | POA: Diagnosis not present

## 2020-11-20 DIAGNOSIS — R922 Inconclusive mammogram: Secondary | ICD-10-CM | POA: Diagnosis not present

## 2020-11-22 DIAGNOSIS — D509 Iron deficiency anemia, unspecified: Secondary | ICD-10-CM | POA: Diagnosis not present

## 2020-11-22 DIAGNOSIS — M810 Age-related osteoporosis without current pathological fracture: Secondary | ICD-10-CM | POA: Diagnosis not present

## 2020-11-22 DIAGNOSIS — E785 Hyperlipidemia, unspecified: Secondary | ICD-10-CM | POA: Diagnosis not present

## 2020-11-22 DIAGNOSIS — M1612 Unilateral primary osteoarthritis, left hip: Secondary | ICD-10-CM | POA: Diagnosis not present

## 2020-11-22 DIAGNOSIS — E1169 Type 2 diabetes mellitus with other specified complication: Secondary | ICD-10-CM | POA: Diagnosis not present

## 2020-11-22 DIAGNOSIS — M1610 Unilateral primary osteoarthritis, unspecified hip: Secondary | ICD-10-CM | POA: Diagnosis not present

## 2020-11-22 DIAGNOSIS — E1165 Type 2 diabetes mellitus with hyperglycemia: Secondary | ICD-10-CM | POA: Diagnosis not present

## 2020-11-22 DIAGNOSIS — I10 Essential (primary) hypertension: Secondary | ICD-10-CM | POA: Diagnosis not present

## 2020-11-22 DIAGNOSIS — D0512 Intraductal carcinoma in situ of left breast: Secondary | ICD-10-CM | POA: Diagnosis not present

## 2020-12-18 ENCOUNTER — Telehealth: Payer: Self-pay | Admitting: Oncology

## 2020-12-18 NOTE — Telephone Encounter (Signed)
Scheduled appt per 7/25 sch msg. Pt aware.  

## 2020-12-30 ENCOUNTER — Ambulatory Visit: Payer: Medicare Other | Admitting: Oncology

## 2021-01-02 DIAGNOSIS — R921 Mammographic calcification found on diagnostic imaging of breast: Secondary | ICD-10-CM | POA: Diagnosis not present

## 2021-01-02 DIAGNOSIS — N641 Fat necrosis of breast: Secondary | ICD-10-CM | POA: Diagnosis not present

## 2021-01-07 DIAGNOSIS — I10 Essential (primary) hypertension: Secondary | ICD-10-CM | POA: Diagnosis not present

## 2021-01-07 DIAGNOSIS — E785 Hyperlipidemia, unspecified: Secondary | ICD-10-CM | POA: Diagnosis not present

## 2021-01-07 DIAGNOSIS — M1612 Unilateral primary osteoarthritis, left hip: Secondary | ICD-10-CM | POA: Diagnosis not present

## 2021-01-07 DIAGNOSIS — M810 Age-related osteoporosis without current pathological fracture: Secondary | ICD-10-CM | POA: Diagnosis not present

## 2021-01-07 DIAGNOSIS — D509 Iron deficiency anemia, unspecified: Secondary | ICD-10-CM | POA: Diagnosis not present

## 2021-01-07 DIAGNOSIS — E1165 Type 2 diabetes mellitus with hyperglycemia: Secondary | ICD-10-CM | POA: Diagnosis not present

## 2021-01-07 DIAGNOSIS — E1169 Type 2 diabetes mellitus with other specified complication: Secondary | ICD-10-CM | POA: Diagnosis not present

## 2021-01-07 DIAGNOSIS — D0512 Intraductal carcinoma in situ of left breast: Secondary | ICD-10-CM | POA: Diagnosis not present

## 2021-01-07 DIAGNOSIS — M1610 Unilateral primary osteoarthritis, unspecified hip: Secondary | ICD-10-CM | POA: Diagnosis not present

## 2021-01-08 ENCOUNTER — Inpatient Hospital Stay: Payer: Medicare Other | Attending: Oncology | Admitting: Oncology

## 2021-01-08 ENCOUNTER — Other Ambulatory Visit: Payer: Self-pay

## 2021-01-08 VITALS — BP 143/73 | HR 88 | Temp 98.1°F | Resp 18 | Ht 61.25 in | Wt 171.1 lb

## 2021-01-08 DIAGNOSIS — Z170 Estrogen receptor positive status [ER+]: Secondary | ICD-10-CM | POA: Insufficient documentation

## 2021-01-08 DIAGNOSIS — D0512 Intraductal carcinoma in situ of left breast: Secondary | ICD-10-CM | POA: Insufficient documentation

## 2021-01-08 DIAGNOSIS — Z90710 Acquired absence of both cervix and uterus: Secondary | ICD-10-CM | POA: Diagnosis not present

## 2021-01-08 DIAGNOSIS — Z90721 Acquired absence of ovaries, unilateral: Secondary | ICD-10-CM | POA: Insufficient documentation

## 2021-01-08 DIAGNOSIS — Z79810 Long term (current) use of selective estrogen receptor modulators (SERMs): Secondary | ICD-10-CM | POA: Diagnosis not present

## 2021-01-08 MED ORDER — TAMOXIFEN CITRATE 20 MG PO TABS: 20.0000 mg | ORAL_TABLET | Freq: Every day | ORAL | 12 refills | Status: AC

## 2021-01-08 NOTE — Progress Notes (Signed)
[place]  [phone] [phone]     ID: [name] DOB: 10-28-1945  MR#: [id number]  IRW#:[id number]  Patient Care Team: Leeroy Cha, MD as PCP - General (Internal Medicine) Mauro Kaufmann, RN as Oncology Nurse Navigator Rockwell Germany, RN as Oncology Nurse Navigator Coralie Keens, MD as Consulting Physician (General Surgery) Aran Menning, Virgie Dad, MD as Consulting Physician (Oncology) Kyung Rudd, MD as Consulting Physician (Radiation Oncology) Wilford Corner, MD as Consulting Physician (Gastroenterology) Chauncey Cruel, MD OTHER MD:   CHIEF COMPLAINT: Ductal carcinoma in situ, estrogen receptor positive  CURRENT TREATMENT: Tamoxifen   INTERVAL HISTORY: Alisha Delgado returns today for follow up of her noninvasive breast cancer.  Since her last visit here she underwent bilateral diagnostic at [facility] 11/20/2020 showing breast density category C.  This showed in addition to postoperative changes, a 1.7cm grouped heterogeneous calcifications in the left breast at the lumpectomy site.  Biopsy was performed and the preliminary results came back benign.  However they are not yet in epic.  They were sent to the patient's primary care physician which is how she got the results.  To review: She was started on tamoxifen at her last visit on 08/06/2020.  She is tolerating this better.  She no longer has problems with cramps and hot flashes have diminished.  Still have night sweats.  She does not feel she needs estrogen cream for vaginal dryness problems but she knows that is an option while under tamoxifen.Marland Kitchen   REVIEW OF SYSTEMS: [name] is exercising chiefly by walking.  She just got 2 puppies which are keeping her busy.  Also Scotts grandchildren aged 39 and 43 state within 2 weeks.  Overall she is doing "really well" and a detailed review of systems was benign   COVID 19 VACCINATION STATUS: fully vaccinated [employer]) with booster in September  2021   HISTORY OF CURRENT ILLNESS: From the original intake note:  Alisha Delgado had routine screening mammography on 03/06/2020 showing calcifications in the left breast. She underwent left diagnostic mammography with tomography at [facility] on 03/25/2020 showing: breast density category C; 4.7 cm area of grouped calcifications in upper-inner left breast.  Accordingly on 04/09/2020 she proceeded to biopsy of the left breast area in question. The pathology from this procedure ([id number]) showed: ductal carcinoma in situ, high grade with necrosis and calcifications. Prognostic indicators significant for: estrogen receptor, 100% positive with strong staining intensity and progesterone receptor, 45% positive with moderate-strong staining intensity.   The patient's subsequent history is as detailed below.   PAST MEDICAL HISTORY: Past Medical History:  Diagnosis Date   Anemia    Arthritis    Blood transfusion    Breast cancer ([name])    Cataract    Diabetes mellitus    Distal radius fracture, left 2015   Family history of breast cancer 04/25/2020   Family history of ovarian cancer 04/25/2020   GERD (gastroesophageal reflux disease) 2012   Headache    migraines   Hypertension    Osteopenia 2019   Osteoporosis    Osteoporosis 2019   At [facility]   Pneumonia    Proteinuria 2016   Ribs, multiple fractures 2015   Ulcer     PAST SURGICAL HISTORY: Past Surgical History:  Procedure Laterality Date   ABDOMINAL HYSTERECTOMY  1976   with oophorectomy   APPENDECTOMY  1976   BACK SURGERY     BREAST LUMPECTOMY WITH RADIOACTIVE SEED LOCALIZATION Left 05/07/2020   Procedure: LEFT BREAST LUMPECTOMY  WITH RADIOACTIVE SEED LOCALIZATION;  Surgeon: Coralie Keens, MD;  Location: [place];  Service: General;  Laterality: Left;   BREAST SURGERY Right 2016   Dr. Marlou Starks   CYSTECTOMY Right 2016   Dr. Marlou Starks   EYE SURGERY Bilateral 2014   Dr. Tommy Rainwater   FRACTURE SURGERY Left 1995   [name]   Dr. Durene Cal   TIBIA FRACTURE SURGERY Left 1995   as well as knee surgery patella and ACL were torn by [facility]    FAMILY HISTORY: Family History  Problem Relation Age of Onset   Ovarian cancer Mother 52   Ovarian cancer Maternal Aunt 34   Breast cancer Maternal Aunt 90   Breast cancer Paternal Aunt        dx 35s   Brain cancer Paternal Grandfather 4   Ovarian cancer Cousin 64   Ovarian cancer Cousin        dx 29s   Breast cancer Cousin 58       maternal female cousin   Breast cancer Cousin        maternal female cousin; dx unknown age   Breast cancer Cousin        maternal female cousin; dx unknown age   Her mother died at age 5 from ovarian cancer, which was diagnosed at age 17. Her father died at age 75 from heart problems. Alisha Delgado has 2 brothers and 1 sister. In addition to her mother, a maternal aunt and two maternal cousins also had ovarian cancer. She also reports aunts on both sides of the family with breast cancer. Finally, she also notes brain cancer in her paternal grandfather at age 32.   GYNECOLOGIC HISTORY:  Patient's last menstrual period was 05/25/1974. Menarche: 75 years old GX P 0 LMP age 39, with hysterectomy Contraceptive: never used HRT: used following hysterectomy up until around age 83.  Hysterectomy? Yes, 1976 for endometriosis BSO? 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 She is widowed. Her current significant other, Dr. Teressa Lower, is a retired pulmonologist here in [place]. She lives by herself, HER-2 pet dogs having recently died May 11, 2020).. 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 Michail Sermon)  PAP: date unknown, s/p hysterectomy   Bone density: 12/2018, osteopenia   Allergies  Allergen Reactions   Codeine Itching    With  High Dose  Codeine   -  Causes  Itching.   Atorvastatin Other (See Comments)   Other Other (See Comments)   Pravastatin Other (See Comments)    Current Outpatient Medications  Medication Sig Dispense Refill   alendronate (FOSAMAX) 70 MG tablet Take 70 mg by mouth every Monday. Take with a full glass of water on an empty stomach.     atorvastatin (LIPITOR) 20 MG tablet Take 20 mg by mouth daily with supper.      Calcium Carbonate-Vitamin D (CALCIUM 600 + D PO) Take 1 tablet by mouth in the morning and at bedtime.      ferrous sulfate 325 (65 FE) MG tablet Take 325 mg by mouth daily with lunch.      glimepiride (AMARYL) 4 MG tablet Take 4 mg by mouth daily with breakfast.     Glucose Blood (ACCU-CHEK AVIVA PLUS VI) USE TO TEST ONCE A DAY AS DIRECTED     glucose blood test strip See admin instructions.     Krill Oil 300 MG CAPS Take 300 mg by mouth at bedtime.      Lancets (ACCU-CHEK MULTICLIX) lancets See admin instructions.     losartan-hydrochlorothiazide (HYZAAR) 100-12.5 MG tablet Take 1 tablet by mouth at bedtime.      meloxicam (MOBIC) 15 MG tablet Take 15 mg by mouth daily as needed (hip pain.).      metFORMIN (GLUCOPHAGE) 500 MG tablet  Take 2,000 mg by mouth daily after supper.     Multiple Vitamin (MULTIVITAMIN WITH MINERALS) TABS tablet Take 1 tablet by mouth daily.     omeprazole (PRILOSEC) 20 MG capsule Take 20 mg by mouth daily as needed (acid reflux/indigestion.).      Polyethyl Glycol-Propyl Glycol (SYSTANE) 0.4-0.3 % SOLN Place 1 drop into both eyes 3 (three) times daily as needed (dry/irritated eyes).     rizatriptan (MAXALT) 10 MG tablet Take 10 mg by mouth 2 (two) times daily as needed for migraine.  (Patient not taking: Reported on 06/05/2020)     vitamin B-12 (CYANOCOBALAMIN) 100 MCG tablet Take 100 mcg by mouth daily with lunch.      vitamin C (ASCORBIC ACID) 500 MG tablet Take 500 mg by mouth daily with lunch.      No current facility-administered medications for this visit.    OBJECTIVE: White woman who appears younger than stated age  28:   01/08/21 1615  BP: (!) 143/73  Pulse: 88  Resp: 18  Temp: 98.1 F (36.7 C)  SpO2: 96%     Body mass index is 32.07 kg/m.   Wt Readings from Last 3 Encounters:  01/08/21 171 lb 1.6 oz (77.6 kg)  08/06/20 175 lb 6.4 oz (79.6 kg)  06/05/20 171 lb 2 oz (77.6 kg)      ECOG FS:1 - Symptomatic but completely ambulatory  Sclerae unicteric, EOMs intact Oropharynx clear and moist No cervical or supraclavicular adenopathy Lungs no rales or rhonchi Heart regular rate and rhythm Abd soft, nontender, positive bowel sounds MSK no focal spinal tenderness Neuro: nonfocal, well oriented, appropriate affect Breasts: I do not palpate a mass in either breast.  There are no skin or nipple changes of concern.  Both axillae are benign.   LAB RESULTS:  CMP     Component Value Date/Time   NA 137 05/03/2020 1430   K 3.5 05/03/2020 1430   CL 98 05/03/2020 1430   CO2 28 05/03/2020 1430   GLUCOSE 138 (H) 05/03/2020 1430  BUN 18 05/03/2020 1430   CREATININE 0.73 05/03/2020 1430   CREATININE 0.82 04/24/2020 1223   CALCIUM 9.9 05/03/2020 1430   PROT 7.1 04/24/2020  1223   ALBUMIN 3.7 04/24/2020 1223   AST 34 04/24/2020 1223   ALT 41 04/24/2020 1223   ALKPHOS 85 04/24/2020 1223   BILITOT 0.7 04/24/2020 1223   GFRNONAA >60 05/03/2020 1430   GFRNONAA >60 04/24/2020 1223    Lab Results  Component Value Date   TOTALPROTELP 6.8 06/05/2011   ALBUMINELP 60.0 06/05/2011   A1GS 5.0 (H) 06/05/2011   A2GS 11.7 06/05/2011   BETS 7.8 (H) 06/05/2011   BETA2SER 4.1 06/05/2011   GAMS 11.4 06/05/2011   MSPIKE NOT DET 06/05/2011   SPEI * 06/05/2011    Lab Results  Component Value Date   WBC 7.3 05/03/2020   NEUTROABS 4.5 04/24/2020   HGB 13.2 05/03/2020   HCT 39.7 05/03/2020   MCV 91.9 05/03/2020   PLT 325 05/03/2020    No results found for: LABCA2  No components found for: QQPYPP509  No results for input(s): INR in the last 168 hours.  No results found for: LABCA2  No results found for: TOI712  No results found for: WPY099  No results found for: IPJ825  No results found for: CA2729  No components found for: HGQUANT  No results found for: CEA1 / No results found for: CEA1   No results found for: AFPTUMOR  No results found for: CHROMOGRNA  No results found for: KPAFRELGTCHN, LAMBDASER, KAPLAMBRATIO (kappa/lambda light chains)  No results found for: HGBA, HGBA2QUANT, HGBFQUANT, HGBSQUAN (Hemoglobinopathy evaluation)   No results found for: LDH  Lab Results  Component Value Date   IRON 36 (L) 07/21/2011   TIBC 310 07/21/2011   IRONPCTSAT 12 (L) 07/21/2011   (Iron and TIBC)  Lab Results  Component Value Date   FERRITIN 66 07/21/2011    Urinalysis    Component Value Date/Time   LABSPEC 1.025 06/05/2011 1142   PHURINE 6.0 06/05/2011 1142   HGBUR Negative 06/05/2011 1142   BILIRUBINUR Negative 06/05/2011 1142   KETONESUR Negative 06/05/2011 1142   PROTEINUR < 30 06/05/2011 1142   NITRITE Negative 06/05/2011 1142   LEUKOCYTESUR Negative 06/05/2011 1142    STUDIES: No results found.   ELIGIBLE FOR AVAILABLE  RESEARCH PROTOCOL: AET  ASSESSMENT: 75 y.o. [place] woman status post left breast biopsy 04/09/2020 for ductal carcinoma in situ, high-grade, estrogen and progesterone receptor positive  (1) status post left lumpectomy with no sentinel lymph node sampling 05/07/2020 for a 2.1 cm ductal carcinoma in situ, high-grade, with negative margins  (2) genetics testing 05/04/2020 through the [facility] Common Hereditary Cancers Panel offered by [facility] found no deleterious mutations in APC, ATM, AXIN2, BARD1, BMPR1A, BRCA1, BRCA2, BRIP1, CDH1, CDK4, CDKN2A (p14ARF), CDKN2A (p16INK4a), CHEK2, CTNNA1, DICER1, EPCAM (Deletion/duplication testing only), GREM1 (promoter region deletion/duplication testing only), KIT, MEN1, MLH1, MSH2, MSH3, MSH6, MUTYH, NBN, NF1, NHTL1, PALB2, PDGFRA, PMS2, POLD1, POLE, PTEN, RAD50, RAD51C, RAD51D, RNF43, SDHB, SDHC, SDHD, SMAD4, SMARCA4. STK11, TP53, TSC1, TSC2, and VHL.  The following genes were evaluated for sequence changes only: SDHA and HOXB13 c.251G>A variant only.  (a) Variants of uncertain significance detected in APC c.6694C>G (p.His2232Asp), POLD1 c.632G>A (p.Arg211His), and RNF43 c.667C>T (p.Arg223Cys).    (3) adjuvant radiation 06/13/2020 through 07/10/2020 Site Technique Total Dose (Gy) Dose per Fx (Gy) Completed Fx Beam Energies  Breast, Left: Breast_Lt 3D 42.56/42.56 2.66 16/16 6X, 10X  Breast, Left: Breast_Lt_Bst 3D 8/8 2 4/4 6X, 10X    (  4) tamoxifen started 08/07/2018 to  (a) bone density at [facility] 01/06/2019 showed a T score of -2.3  (b) s/p remote hysterectomy   PLAN: Alisha Delgado is now 8 months out from definitive surgery for her noninvasive breast cancer.  She is tolerating tamoxifen well.  Her overall prognosis is excellent.  She was understandably concerned about this biopsy and wonders if she is going to be needing biopsies frequently for the rest of her life after our discussion today I think she was reassured.  In particular she has a good understanding of the  pathophysiology of fat necrosis.  She will have her repeat mammography a year from now and she will return to see [place] shortly after that.  She knows to call for any other issue that may develop before then  Total encounter time 20 minutes.Sarajane Jews C. Zandria Woldt, MD 01/08/2021 4:58 PM Medical Oncology and Hematology [facility] [place], [place] [address] Tel. [phone]    Fax. [phone]   This document serves as a record of services personally performed by Lurline Del, MD. It was created on his behalf by Wilburn Mylar, a trained medical scribe. The creation of this record is based on the scribe's personal observations and the provider's statements to them.   I, Lurline Del MD, have reviewed the above documentation for accuracy and completeness, and I agree with the above.   *Total Encounter Time as defined by the Centers for Medicare and Medicaid Services includes, in addition to the face-to-face time of a patient visit (documented in the note above) non-face-to-face time: obtaining and reviewing outside history, ordering and reviewing medications, tests or procedures, care coordination (communications with other health care professionals or caregivers) and documentation in the medical record.

## 2021-03-18 DIAGNOSIS — I10 Essential (primary) hypertension: Secondary | ICD-10-CM | POA: Diagnosis not present

## 2021-03-18 DIAGNOSIS — Z6831 Body mass index (BMI) 31.0-31.9, adult: Secondary | ICD-10-CM | POA: Diagnosis not present

## 2021-03-18 DIAGNOSIS — Z7984 Long term (current) use of oral hypoglycemic drugs: Secondary | ICD-10-CM | POA: Diagnosis not present

## 2021-03-18 DIAGNOSIS — Z23 Encounter for immunization: Secondary | ICD-10-CM | POA: Diagnosis not present

## 2021-03-18 DIAGNOSIS — E1169 Type 2 diabetes mellitus with other specified complication: Secondary | ICD-10-CM | POA: Diagnosis not present

## 2021-03-18 DIAGNOSIS — E785 Hyperlipidemia, unspecified: Secondary | ICD-10-CM | POA: Diagnosis not present

## 2021-04-23 DIAGNOSIS — Z23 Encounter for immunization: Secondary | ICD-10-CM | POA: Diagnosis not present

## 2021-06-25 DIAGNOSIS — H2513 Age-related nuclear cataract, bilateral: Secondary | ICD-10-CM | POA: Diagnosis not present

## 2021-07-08 ENCOUNTER — Encounter (HOSPITAL_COMMUNITY): Payer: Self-pay

## 2021-10-06 DIAGNOSIS — D0512 Intraductal carcinoma in situ of left breast: Secondary | ICD-10-CM | POA: Diagnosis not present

## 2021-10-06 DIAGNOSIS — R748 Abnormal levels of other serum enzymes: Secondary | ICD-10-CM | POA: Diagnosis not present

## 2021-10-06 DIAGNOSIS — I10 Essential (primary) hypertension: Secondary | ICD-10-CM | POA: Diagnosis not present

## 2021-10-06 DIAGNOSIS — Z0000 Encounter for general adult medical examination without abnormal findings: Secondary | ICD-10-CM | POA: Diagnosis not present

## 2021-10-06 DIAGNOSIS — G43909 Migraine, unspecified, not intractable, without status migrainosus: Secondary | ICD-10-CM | POA: Diagnosis not present

## 2021-10-06 DIAGNOSIS — E785 Hyperlipidemia, unspecified: Secondary | ICD-10-CM | POA: Diagnosis not present

## 2021-10-06 DIAGNOSIS — M1612 Unilateral primary osteoarthritis, left hip: Secondary | ICD-10-CM | POA: Diagnosis not present

## 2021-10-06 DIAGNOSIS — E1169 Type 2 diabetes mellitus with other specified complication: Secondary | ICD-10-CM | POA: Diagnosis not present

## 2021-10-06 DIAGNOSIS — M8588 Other specified disorders of bone density and structure, other site: Secondary | ICD-10-CM | POA: Diagnosis not present

## 2021-12-29 ENCOUNTER — Other Ambulatory Visit: Payer: Self-pay

## 2021-12-29 DIAGNOSIS — D0512 Intraductal carcinoma in situ of left breast: Secondary | ICD-10-CM

## 2021-12-29 NOTE — Progress Notes (Signed)
Pt scheduled for bilat MM at [facility] 01/05/22

## 2022-01-05 DIAGNOSIS — M8589 Other specified disorders of bone density and structure, multiple sites: Secondary | ICD-10-CM | POA: Diagnosis not present

## 2022-01-05 DIAGNOSIS — R922 Inconclusive mammogram: Secondary | ICD-10-CM | POA: Diagnosis not present

## 2022-01-05 DIAGNOSIS — R921 Mammographic calcification found on diagnostic imaging of breast: Secondary | ICD-10-CM | POA: Diagnosis not present

## 2022-01-09 ENCOUNTER — Telehealth: Payer: Self-pay | Admitting: Hematology and Oncology

## 2022-01-09 NOTE — Telephone Encounter (Signed)
Pt called in to sch appt with Dr. Lindi Adie. Pt is aware of appt date/time.

## 2022-01-09 NOTE — Telephone Encounter (Signed)
Called pt to sch appt with Dr. Lindi Adie, okay per MD. No answer. Left msg for pt to call back to sch appt.

## 2022-01-16 ENCOUNTER — Encounter: Payer: Self-pay | Admitting: Hematology and Oncology

## 2022-01-21 NOTE — Progress Notes (Signed)
Patient Care Team: Leeroy Cha, MD as PCP - General (Internal Medicine) Mauro Kaufmann, RN as Oncology Nurse Navigator Rockwell Germany, RN as Oncology Nurse Navigator Coralie Keens, MD as Consulting Physician (General Surgery) Magrinat, Virgie Dad, MD (Inactive) as Consulting Physician (Oncology) Kyung Rudd, MD as Consulting Physician (Radiation Oncology) Wilford Corner, MD as Consulting Physician (Gastroenterology)  DIAGNOSIS:  Encounter Diagnosis  Name Primary?   Ductal carcinoma in situ (DCIS) of left breast     SUMMARY OF ONCOLOGIC HISTORY: Oncology History  Ductal carcinoma in situ (DCIS) of left breast  04/23/2020 Initial Diagnosis   Ductal carcinoma in situ (DCIS) of left breast   05/04/2020 Genetic Testing   Negative genetic testing: no pathogenic variants detected in [facility] Common Hereditary Cancers Panel.  Variants of uncertain significance detected in APC c.6694C>G (p.His2232Asp), POLD1 c.632G>A (p.Arg211His), and RNF43 c.667C>T (p.Arg223Cys).  The report date is May 04, 2020.   The Common Hereditary Cancers Panel offered by [facility] includes sequencing and/or deletion duplication testing of the following 48 genes: APC, ATM, AXIN2, BARD1, BMPR1A, BRCA1, BRCA2, BRIP1, CDH1, CDK4, CDKN2A (p14ARF), CDKN2A (p16INK4a), CHEK2, CTNNA1, DICER1, EPCAM (Deletion/duplication testing only), GREM1 (promoter region deletion/duplication testing only), KIT, MEN1, MLH1, MSH2, MSH3, MSH6, MUTYH, NBN, NF1, NHTL1, PALB2, PDGFRA, PMS2, POLD1, POLE, PTEN, RAD50, RAD51C, RAD51D, RNF43, SDHB, SDHC, SDHD, SMAD4, SMARCA4. STK11, TP53, TSC1, TSC2, and VHL.  The following genes were evaluated for sequence changes only: SDHA and HOXB13 c.251G>A variant only.     CHIEF COMPLIANT: Follow-up Dcis breast cancer surveillance and establish oncology care with Dr. Lindi Adie  INTERVAL HISTORY: Alisha Delgado is a 76 y.o with the above mention Dcis. She presents to the clinic today for a  follow-up. She states that she tolerating the tamoxifen. She did have same leg cramps at the beginning but they have subsided. She does have occasionally night sweats. She works in her garden and walk her dogs for exercising.   ALLERGIES:  is allergic to codeine, atorvastatin, other, and pravastatin.  MEDICATIONS:  Current Outpatient Medications  Medication Sig Dispense Refill   alendronate (FOSAMAX) 70 MG tablet Take 70 mg by mouth every Monday. Take with a full glass of water on an empty stomach.     atorvastatin (LIPITOR) 20 MG tablet Take 20 mg by mouth daily with supper.      Calcium Carbonate-Vitamin D (CALCIUM 600 + D PO) Take 1 tablet by mouth in the morning and at bedtime.      FARXIGA 10 MG TABS tablet Take 10 mg by mouth daily.     ferrous sulfate 325 (65 FE) MG tablet Take 325 mg by mouth daily with lunch.      glimepiride (AMARYL) 4 MG tablet Take 4 mg by mouth daily with breakfast.     Glucose Blood (ACCU-CHEK AVIVA PLUS VI) USE TO TEST ONCE A DAY AS DIRECTED     glucose blood test strip See admin instructions.     Krill Oil 300 MG CAPS Take 300 mg by mouth at bedtime.      Lancets (ACCU-CHEK MULTICLIX) lancets See admin instructions.     losartan-hydrochlorothiazide (HYZAAR) 100-12.5 MG tablet Take 1 tablet by mouth at bedtime.      meloxicam (MOBIC) 15 MG tablet Take 15 mg by mouth daily as needed (hip pain.).      metFORMIN (GLUCOPHAGE) 500 MG tablet Take 2,000 mg by mouth daily after supper.     Multiple Vitamin (MULTIVITAMIN WITH MINERALS) TABS tablet Take 1 tablet by  mouth daily.     omeprazole (PRILOSEC) 20 MG capsule Take 20 mg by mouth daily as needed (acid reflux/indigestion.).      Polyethyl Glycol-Propyl Glycol (SYSTANE) 0.4-0.3 % SOLN Place 1 drop into both eyes 3 (three) times daily as needed (dry/irritated eyes).     rizatriptan (MAXALT) 10 MG tablet Take 10 mg by mouth 2 (two) times daily as needed for migraine.  (Patient not taking: Reported on 06/05/2020)      tamoxifen (NOLVADEX) 20 MG tablet Take 1 tablet (20 mg total) by mouth daily. 90 tablet 3   vitamin B-12 (CYANOCOBALAMIN) 100 MCG tablet Take 100 mcg by mouth daily with lunch.      vitamin C (ASCORBIC ACID) 500 MG tablet Take 500 mg by mouth daily with lunch.      No current facility-administered medications for this visit.    PHYSICAL EXAMINATION: ECOG PERFORMANCE STATUS: 1 - Symptomatic but completely ambulatory  Vitals:   01/23/22 0821  BP: (!) 150/83  Pulse: 88  Resp: 16  Temp: 97.8 F (36.6 C)  SpO2: 93%   Filed Weights   01/23/22 0821  Weight: 163 lb 3.2 oz (74 kg)    BREAST: No palpable masses or nodules in either right or left breasts. No palpable axillary supraclavicular or infraclavicular adenopathy no breast tenderness or nipple discharge. (exam performed in the presence of a chaperone)  LABORATORY DATA:  I have reviewed the data as listed    Latest Ref Rng & Units 05/03/2020    2:30 PM 04/24/2020   12:23 PM 07/21/2011    1:46 PM  CMP  Glucose 70 - 99 mg/dL 138  236  91   BUN 8 - 23 mg/dL _0 Creatinine 0.44 - 1.00 mg/dL 0.73  0.82  0.79   Sodium 135 - 145 mmol/L 137  138  140   Potassium 3.5 - 5.1 mmol/L 3.5  4.0  4.3   Chloride 98 - 111 mmol/L 98  103  106   CO2 22 - 32 mmol/L _1 Calcium 8.9 - 10.3 mg/dL 9.9  10.7  9.3   Total Protein 6.5 - 8.1 g/dL  7.1    Total Bilirubin 0.3 - 1.2 mg/dL  0.7    Alkaline Phos 38 - 126 U/L  85    AST 15 - 41 U/L  34    ALT 0 - 44 U/L  41      Lab Results  Component Value Date   WBC 7.3 05/03/2020   HGB 13.2 05/03/2020   HCT 39.7 05/03/2020   MCV 91.9 05/03/2020   PLT 325 05/03/2020   NEUTROABS 4.5 04/24/2020    ASSESSMENT & PLAN:  Ductal carcinoma in situ (DCIS) of left breast 04/09/2020: Left breast biopsy: DCIS high-grade ER/PR positive 05/07/2020: Left lumpectomy: 2.1 cm DCIS high-grade negative margins 05/04/2020: Genetics: Negative 06/13/2018 to 07/10/2020: Adjuvant radiation  Current  treatment: Tamoxifen started 08/07/2018 Tamoxifen toxicities: Intermittent muscle cramps in the legs got better Occasional hot flashes: Tolerating it well  Breast cancer surveillance: 1.  Breast exam 01/23/2022: Benign 2. mammogram 01/05/2022 at [facility]: Benign breast density category C  Return to clinic in 1 year for follow-up    No orders of the defined types were placed in this encounter.  The patient has a good understanding of the overall plan. she agrees with it. she will call with any problems that may develop before the next visit here. Total time  spent: 30 mins including face to face time and time spent for planning, charting and co-ordination of care   Harriette Ohara, MD 01/23/22    I Gardiner Coins am scribing for Dr. Lindi Adie  I have reviewed the above documentation for accuracy and completeness, and I agree with the above.

## 2022-01-23 ENCOUNTER — Inpatient Hospital Stay: Payer: Medicare Other | Attending: Hematology and Oncology | Admitting: Hematology and Oncology

## 2022-01-23 ENCOUNTER — Other Ambulatory Visit: Payer: Self-pay

## 2022-01-23 DIAGNOSIS — Z79810 Long term (current) use of selective estrogen receptor modulators (SERMs): Secondary | ICD-10-CM | POA: Diagnosis not present

## 2022-01-23 DIAGNOSIS — D0512 Intraductal carcinoma in situ of left breast: Secondary | ICD-10-CM | POA: Diagnosis not present

## 2022-01-23 DIAGNOSIS — Z170 Estrogen receptor positive status [ER+]: Secondary | ICD-10-CM | POA: Insufficient documentation

## 2022-01-23 MED ORDER — TAMOXIFEN CITRATE 20 MG PO TABS: 20.0000 mg | ORAL_TABLET | Freq: Every day | ORAL | 3 refills | Status: DC

## 2022-01-23 NOTE — Assessment & Plan Note (Signed)
04/09/2020: Left breast biopsy: DCIS high-grade ER/PR positive 05/07/2020: Left lumpectomy: 2.1 cm DCIS high-grade negative margins 05/04/2020: Genetics: Negative 06/13/2018 to 07/10/2020: Adjuvant radiation  Current treatment: Tamoxifen started 08/07/2018 Tamoxifen toxicities:  Breast cancer surveillance: 1.  Breast exam 01/23/2022: Benign 2. mammogram 01/05/2022 at [facility]: Benign breast density category C  Return to clinic in 1 year for follow-up

## 2022-01-30 ENCOUNTER — Other Ambulatory Visit: Payer: Self-pay | Admitting: *Deleted

## 2022-01-30 DIAGNOSIS — I8393 Asymptomatic varicose veins of bilateral lower extremities: Secondary | ICD-10-CM

## 2022-02-02 DIAGNOSIS — E1169 Type 2 diabetes mellitus with other specified complication: Secondary | ICD-10-CM | POA: Diagnosis not present

## 2022-02-02 DIAGNOSIS — Z23 Encounter for immunization: Secondary | ICD-10-CM | POA: Diagnosis not present

## 2022-02-02 DIAGNOSIS — R748 Abnormal levels of other serum enzymes: Secondary | ICD-10-CM | POA: Diagnosis not present

## 2022-02-10 ENCOUNTER — Encounter: Payer: Self-pay | Admitting: Vascular Surgery

## 2022-02-10 ENCOUNTER — Ambulatory Visit: Payer: Medicare Other | Admitting: Vascular Surgery

## 2022-02-10 ENCOUNTER — Ambulatory Visit (HOSPITAL_COMMUNITY)
Admission: RE | Admit: 2022-02-10 | Discharge: 2022-02-10 | Disposition: A | Discharge status: Home / Self Care | Payer: Medicare Other | Source: Ambulatory Visit | Attending: Vascular Surgery | Admitting: Vascular Surgery

## 2022-02-10 DIAGNOSIS — I8393 Asymptomatic varicose veins of bilateral lower extremities: Secondary | ICD-10-CM | POA: Insufficient documentation

## 2022-02-10 DIAGNOSIS — I872 Venous insufficiency (chronic) (peripheral): Secondary | ICD-10-CM

## 2022-02-10 NOTE — Progress Notes (Signed)
Patient name: Alisha Delgado MRN: [id number] DOB: 26-Feb-1946 Sex: female  REASON FOR CONSULT: Evaluate lower extremity varicose veins  HPI: Alisha Delgado is a 76 y.o. female, with history of diabetes, hypertension, breast cancer that presents for evaluation of lower extremity varicose veins.  Patient has noticed multiple prominent lower extremity varicosities.  On the left she does complain of an aching throughout the day worse around the calf.  She describes this as throbbing as the day progresses.  She does not wear compression stockings.  No history of DVT.  No previous lower extremity venous interventions.  She is interested in sclerotherapy.   Past Medical History:  Diagnosis Date   Anemia    Arthritis    Blood transfusion    Breast cancer ([place])    Cataract    Diabetes mellitus    Distal radius fracture, left 2015   Family history of breast cancer 04/25/2020   Family history of ovarian cancer 04/25/2020   GERD (gastroesophageal reflux disease) 2012   Headache    migraines   Hypertension    Osteopenia 2019   Osteoporosis    Osteoporosis 2019   At [facility]   Pneumonia    Proteinuria 2016   Ribs, multiple fractures 2015   Ulcer     Past Surgical History:  Procedure Laterality Date   ABDOMINAL HYSTERECTOMY  1976   with oophorectomy   APPENDECTOMY  1976   BACK SURGERY     BREAST LUMPECTOMY WITH RADIOACTIVE SEED LOCALIZATION Left 05/07/2020   Procedure: LEFT BREAST LUMPECTOMY WITH RADIOACTIVE SEED LOCALIZATION;  Surgeon: Coralie Keens, MD;  Location: [facility];  Service: General;  Laterality: Left;   BREAST SURGERY Right 2016   Dr. Marlou Starks   CYSTECTOMY Right 2016   Dr. Marlou Starks   EYE SURGERY Bilateral 2014   Dr. Tommy Rainwater   FRACTURE SURGERY Left 1995   [facility] [place] and 2007   Dr. Durene Cal   TIBIA FRACTURE SURGERY Left 1995   as well as knee surgery patella and ACL were torn by [facility]ell Miller and [facility]    Family History  Problem  Relation Age of Onset   Ovarian cancer Mother 36   Ovarian cancer Maternal Aunt 80   Breast cancer Maternal Aunt 90   Breast cancer Paternal Aunt        dx 25s   Brain cancer Paternal Grandfather 47   Ovarian cancer Cousin 11   Ovarian cancer Cousin        dx 67s   Breast cancer Cousin 62       maternal female cousin   Breast cancer Cousin        maternal female cousin; dx unknown age   Breast cancer Cousin        maternal female cousin; dx unknown age    SOCIAL HISTORY: Social History   Socioeconomic History   Marital status: Widowed    Spouse name: Not on file   Number of children: 3   Years of education: Not on file   Highest education level: Not on file  Occupational History   Not on file  Tobacco Use   Smoking status: Former    Packs/day: 0.50    Years: 2.00    Total pack years: 1.00    Types: Cigarettes    Quit date: 05/25/1988    Years since quitting: 33.7   Smokeless tobacco: Never  Vaping Use  Vaping Use: Never used  Substance and Sexual Activity   Alcohol use: Yes    Comment: Rarely special occasions   Drug use: Never   Sexual activity: Yes  Other Topics Concern   Not on file  Social History Narrative   Not on file   Social Determinants of Health   Financial Resource Strain: Not on file  Food Insecurity: Not on file  Transportation Needs: Not on file  Physical Activity: Not on file  Stress: Not on file  Social Connections: Not on file  Intimate Partner Violence: Not on file    Allergies  Allergen Reactions   Codeine Itching    With High Dose  Codeine   -  Causes  Itching.   Atorvastatin Other (See Comments)   Other Other (See Comments)   Pravastatin Other (See Comments)    Current Outpatient Medications  Medication Sig Dispense Refill   alendronate (FOSAMAX) 70 MG tablet Take 70 mg by mouth every Monday. Take with a full glass of water on an empty stomach.     atorvastatin (LIPITOR) 20 MG tablet Take 20 mg by mouth daily with supper.       Calcium Carbonate-Vitamin D (CALCIUM 600 + D PO) Take 1 tablet by mouth in the morning and at bedtime.      FARXIGA 10 MG TABS tablet Take 10 mg by mouth daily.     ferrous sulfate 325 (65 FE) MG tablet Take 325 mg by mouth daily with lunch.      glimepiride (AMARYL) 4 MG tablet Take 4 mg by mouth daily with breakfast.     Glucose Blood (ACCU-CHEK AVIVA PLUS VI) USE TO TEST ONCE A DAY AS DIRECTED     glucose blood test strip See admin instructions.     Krill Oil 300 MG CAPS Take 300 mg by mouth at bedtime.      Lancets (ACCU-CHEK MULTICLIX) lancets See admin instructions.     losartan-hydrochlorothiazide (HYZAAR) 100-12.5 MG tablet Take 1 tablet by mouth at bedtime.      meloxicam (MOBIC) 15 MG tablet Take 15 mg by mouth daily as needed (hip pain.).      metFORMIN (GLUCOPHAGE) 500 MG tablet Take 2,000 mg by mouth daily after supper.     Multiple Vitamin (MULTIVITAMIN WITH MINERALS) TABS tablet Take 1 tablet by mouth daily.     omeprazole (PRILOSEC) 20 MG capsule Take 20 mg by mouth daily as needed (acid reflux/indigestion.).      Polyethyl Glycol-Propyl Glycol (SYSTANE) 0.4-0.3 % SOLN Place 1 drop into both eyes 3 (three) times daily as needed (dry/irritated eyes).     rizatriptan (MAXALT) 10 MG tablet Take 10 mg by mouth 2 (two) times daily as needed for migraine.  (Patient not taking: Reported on 06/05/2020)     tamoxifen (NOLVADEX) 20 MG tablet Take 1 tablet (20 mg total) by mouth daily. 90 tablet 3   vitamin B-12 (CYANOCOBALAMIN) 100 MCG tablet Take 100 mcg by mouth daily with lunch.      vitamin C (ASCORBIC ACID) 500 MG tablet Take 500 mg by mouth daily with lunch.      No current facility-administered medications for this visit.    REVIEW OF SYSTEMS:  '[X]'$  denotes positive finding, '[ ]'$  denotes negative finding Cardiac  Comments:  Chest pain or chest pressure:    Shortness of breath upon exertion:    Short of breath when lying flat:    Irregular heart rhythm:  Vascular     Pain in calf, thigh, or hip brought on by ambulation:    Pain in feet at night that wakes you up from your sleep:     Blood clot in your veins:    Leg swelling:         Pulmonary    Oxygen at home:    Productive cough:     Wheezing:         Neurologic    Sudden weakness in arms or legs:     Sudden numbness in arms or legs:     Sudden onset of difficulty speaking or slurred speech:    Temporary loss of vision in one eye:     Problems with dizziness:         Gastrointestinal    Blood in stool:     Vomited blood:         Genitourinary    Burning when urinating:     Blood in urine:        Psychiatric    Major depression:         Hematologic    Bleeding problems:    Problems with blood clotting too easily:        Skin    Rashes or ulcers:        Constitutional    Fever or chills:      PHYSICAL EXAM: There were no vitals filed for this visit.  GENERAL: The patient is a well-nourished female, in no acute distress. The vital signs are documented above. CARDIAC: There is a regular rate and rhythm.  VASCULAR:  Palpable femoral pulses bilaterally Palpable DP pulses bilaterally Prominent spider veins and reticular veins bilateral lower extremities PULMONARY: No respiratory distress. ABDOMEN: Soft and non-tender. MUSCULOSKELETAL: There are no major deformities or cyanosis. NEUROLOGIC: No focal weakness or paresthesias are detected. PSYCHIATRIC: The patient has a normal affect.  DATA:   Lower Venous Reflux Study   Patient Name:  SHARADA ALBORNOZ Cangelosi  Date of Exam:   02/10/2022  Medical Rec #: [id number]    Accession #:    [id number]  Date of Birth: 03/07/1946     Patient Gender: F  Patient Age:   38 years  Exam Location:  [facility]  Procedure:      VAS [place] LOWER EXTREMITY VENOUS REFLUX  Referring Phys: Monica Martinez    ---------------------------------------------------------------------------  -----     Indications: Spider veins, venous  insufficiency.     Performing Technologist: Ralene Cork RVT      Examination Guidelines: A complete evaluation includes B-mode imaging,  spectral  Doppler, color Doppler, and power Doppler as needed of all accessible  portions  of each vessel. Bilateral testing is considered an integral part of a  complete  examination. Limited examinations for reoccurring indications may be  performed  as noted. The reflux portion of the exam is performed with the patient in  reverse Trendelenburg.  Significant venous reflux is defined as >500 ms in the superficial venous  system, and >1 second in the deep venous system.      +--------------+---------+------+-----------+------------+--------+  LEFT          Reflux NoRefluxReflux TimeDiameter cmsComments                          Yes                                   +--------------+---------+------+-----------+------------+--------+  CFV           no                                              +--------------+---------+------+-----------+------------+--------+  FV prox       no                                              +--------------+---------+------+-----------+------------+--------+  FV mid        no                                              +--------------+---------+------+-----------+------------+--------+  FV dist       no                                              +--------------+---------+------+-----------+------------+--------+  Popliteal     no                                              +--------------+---------+------+-----------+------------+--------+  GSV at SFJ              yes    >500 ms     0.895              +--------------+---------+------+-----------+------------+--------+  GSV prox thigh          yes    >500 ms     0.339              +--------------+---------+------+-----------+------------+--------+  GSV mid thigh           yes    >500 ms      0.277              +--------------+---------+------+-----------+------------+--------+  GSV dist thighno                           0.382              +--------------+---------+------+-----------+------------+--------+  GSV at knee             yes    >500 ms     0.435              +--------------+---------+------+-----------+------------+--------+  GSV prox calf no                            0.33              +--------------+---------+------+-----------+------------+--------+  SSV Pop Fossa no                           0.201              +--------------+---------+------+-----------+------------+--------+  SSV prox calf no  0.183              +--------------+---------+------+-----------+------------+--------+  SSV mid calf  no                           0.123              +--------------+---------+------+-----------+------------+--------+          Summary:  Left:  - No evidence of deep vein thrombosis seen in the left lower extremity,  from the common femoral through the popliteal veins.  - No evidence of superficial venous thrombosis in the left lower  extremity.  - No evidence of deep vein reflux.  - Superficial vein reflux in the SFJ and GSV.     *See table(s) above for measurements and observations.   Electronically signed by Monica Martinez MD on 02/10/2022 at 11:08:37 AM.  Assessment/Plan:  76 year old female with evidence of chronic venous insufficiency of the bilateral lower extremities consistent with CEAP classification C3.  Her left leg was studied today that shows a long segment superficial reflux in the great saphenous vein including at the [facility] junction.  I have recommended conservative measures including thigh-high compression stockings rated 20 to 30 mmHg, leg elevation and exercise.  I will bring her back in 3 months to see one of my partners to see if she is a candidate for intervention.  I do think  she would be a good candidate for laser ablation of the left GSV.  She is interested in sclerotherapy but discussed I would see if she is a candidate for laser ablation first given if she got sclerotherapy first I would be concerned that she would have a very quick recurrence of her spider/reticular veins without treating the truncal reflux first.   Marty Heck, MD [facility] Office: [phone]

## 2022-05-20 ENCOUNTER — Ambulatory Visit: Payer: Medicare Other | Admitting: Vascular Surgery

## 2022-05-26 ENCOUNTER — Ambulatory Visit (HOSPITAL_COMMUNITY)
Admission: EM | Admit: 2022-05-26 | Discharge: 2022-05-26 | Disposition: A | Discharge status: Home / Self Care | Payer: Medicare Other | Attending: Emergency Medicine | Admitting: Emergency Medicine

## 2022-05-26 ENCOUNTER — Ambulatory Visit (INDEPENDENT_AMBULATORY_CARE_PROVIDER_SITE_OTHER): Payer: Medicare Other

## 2022-05-26 ENCOUNTER — Encounter (HOSPITAL_COMMUNITY): Payer: Self-pay

## 2022-05-26 DIAGNOSIS — R059 Cough, unspecified: Secondary | ICD-10-CM

## 2022-05-26 DIAGNOSIS — R0609 Other forms of dyspnea: Secondary | ICD-10-CM

## 2022-05-26 DIAGNOSIS — R051 Acute cough: Secondary | ICD-10-CM

## 2022-05-26 DIAGNOSIS — R490 Dysphonia: Secondary | ICD-10-CM

## 2022-05-26 DIAGNOSIS — K449 Diaphragmatic hernia without obstruction or gangrene: Secondary | ICD-10-CM | POA: Diagnosis not present

## 2022-05-26 MED ORDER — ALBUTEROL SULFATE HFA 108 (90 BASE) MCG/ACT IN AERS
2.0000 | INHALATION_SPRAY | Freq: Once | RESPIRATORY_TRACT | Status: AC
  Administered 2022-05-26: 2 via RESPIRATORY_TRACT

## 2022-05-26 MED ORDER — ALBUTEROL SULFATE HFA 108 (90 BASE) MCG/ACT IN AERS
INHALATION_SPRAY | RESPIRATORY_TRACT | Status: AC
  Filled 2022-05-26: qty 6.7

## 2022-05-26 MED ORDER — PREDNISONE 20 MG PO TABS: ORAL_TABLET | ORAL | 0 refills | Status: DC

## 2022-05-26 NOTE — ED Provider Notes (Addendum)
[facility]    CSN: [id number] Arrival date & time: 05/26/22  0272      History   Chief Complaint Chief Complaint  Patient presents with   Cough    HPI Alisha Delgado is a 77 y.o. female.  Patient presents complaining of cough, shortness of breath upon exertion, and hoarseness that started 8 weeks ago.  Patient reports upon onset of symptoms she felt as though she caught a viral illness.  She reports wheezing that occurred upon onset which has now resolved.  She reports a history of bronchitis.  She is taking over-the-counter medications with no relief of symptoms.  She reports a history of diabetes mellitus, she reports that her last A1c was approximately 7%.  She states that she follows up with her PCP as directed.  Patient reports a history of breast cancer.   Cough Associated symptoms: shortness of breath (Upon exertion) and wheezing (Upon onset which has now resloved)   Associated symptoms: no chest pain, no chills, no ear pain, no fever, no rhinorrhea and no sore throat     Past Medical History:  Diagnosis Date   Anemia    Arthritis    Blood transfusion    Breast cancer ([place])    Cataract    Diabetes mellitus    Distal radius fracture, left 2015   Family history of breast cancer 04/25/2020   Family history of ovarian cancer 04/25/2020   GERD (gastroesophageal reflux disease) 2012   Headache    migraines   Hypertension    Osteopenia 2019   Osteoporosis    Osteoporosis 2019   At [facility]   Pneumonia    Proteinuria 2016   Ribs, multiple fractures 2015   Ulcer     Patient Active Problem List   Diagnosis Date Noted   Chronic venous insufficiency 02/10/2022   Genetic testing 05/06/2020   Family history of breast cancer 04/25/2020   Family history of ovarian cancer 04/25/2020   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 Marlou Starks   CYSTECTOMY Right 2016   Dr. Marlou Starks   EYE SURGERY Bilateral 2014   Dr. Tommy Rainwater   FRACTURE SURGERY Left 1995   [facility] and [place] and 2007   Dr. Durene Cal   TIBIA FRACTURE SURGERY Left 1995   as well as knee surgery patella and ACL were torn by [facility]ell Miller and [facility]    OB History   No obstetric history on file.      Home Medications    Prior to Admission medications   Medication Sig Start Date End Date Taking? Authorizing Provider  predniSONE (DELTASONE) 20 MG tablet Take 2 tablets on days 1, 2, and 3 , then 1 tablet until day 6 05/26/22  Yes Flossie Dibble, NP  alendronate (FOSAMAX) 70 MG tablet Take 70 mg by mouth every Monday. Take with a full glass of water on an empty stomach.    [provider]  atorvastatin (LIPITOR) 20 MG tablet Take 20 mg by mouth daily with supper.     [provider]  Calcium Carbonate-Vitamin D (CALCIUM 600 + D PO) Take 1 tablet by mouth in the morning and at bedtime.     [provider]  FARXIGA 10 MG TABS tablet Take 10 mg by mouth daily. 01/11/22   [provider]  ferrous sulfate 325 (65 FE) MG tablet Take 325 mg by mouth daily with lunch.     [provider]  glimepiride (AMARYL) 4 MG tablet Take 4 mg by mouth daily with breakfast.    [provider]  Glucose Blood (ACCU-CHEK AVIVA PLUS VI) USE TO TEST ONCE A DAY AS DIRECTED    [provider]  glucose blood test strip See admin instructions.    [provider]  Javier Docker Oil 300 MG CAPS Take 300 mg by mouth at bedtime.     [provider]  Lancets (ACCU-CHEK MULTICLIX) lancets See admin instructions. 04/26/18   [provider]  losartan-hydrochlorothiazide (HYZAAR) 100-12.5 MG tablet Take 1 tablet by mouth at bedtime.     [provider]  meloxicam (MOBIC) 15 MG tablet Take 15 mg by mouth daily as needed (hip pain.).     [provider]  metFORMIN (GLUCOPHAGE) 500 MG tablet Take 2,000 mg by mouth daily after supper.    [provider]  Multiple Vitamin (MULTIVITAMIN WITH MINERALS) TABS tablet Take 1 tablet by mouth daily.    [provider]  omeprazole (PRILOSEC) 20 MG capsule Take 20 mg by mouth daily as needed (acid reflux/indigestion.).     [provider]  Polyethyl Glycol-Propyl Glycol (SYSTANE) 0.4-0.3 % SOLN Place 1 drop into both eyes 3 (three) times daily as needed (dry/irritated eyes).    [provider]  rizatriptan (MAXALT) 10 MG tablet Take 10 mg by mouth 2 (two) times daily as needed for migraine.    [provider]  tamoxifen (NOLVADEX) 20 MG tablet Take 1 tablet (20 mg total) by mouth daily. 01/23/22   Nicholas Lose, MD  vitamin B-12 (CYANOCOBALAMIN) 100 MCG tablet Take 100 mcg by mouth daily with lunch.     [provider]  vitamin C (ASCORBIC ACID) 500 MG tablet Take 500 mg by mouth daily with lunch.     [provider]    Family History Family History  Problem Relation Age of Onset   Ovarian cancer Mother 67   Ovarian cancer Maternal Aunt 32   Breast cancer Maternal Aunt 78   Breast cancer Paternal Aunt        dx 23s   Brain cancer Paternal Grandfather 16   Ovarian cancer Cousin 5   Ovarian cancer Cousin        dx 59s   Breast cancer Cousin 48       maternal female cousin   Breast cancer Cousin        maternal female cousin; dx unknown age   Breast cancer Cousin        maternal female cousin; dx unknown age    Social History Social History   Tobacco Use   Smoking status: Former    Packs/day: 0.50     Years: 2.00    Total pack years: 1.00    Types: Cigarettes    Quit date: 05/25/1988    Years since quitting: 34.0   Smokeless tobacco: Never  Vaping Use   Vaping Use: Never used  Substance Use Topics  Alcohol use: Yes    Comment: Rarely special occasions   Drug use: Never     Allergies   Codeine, Atorvastatin, Other, and Pravastatin   Review of Systems Review of Systems  Constitutional:  Positive for activity change and fatigue. Negative for appetite change, chills and fever.  HENT:  Negative for ear discharge, ear pain, postnasal drip, rhinorrhea, sinus pressure, sinus pain, sore throat and trouble swallowing.   Eyes: Negative.   Respiratory:  Positive for cough, shortness of breath (Upon exertion) and wheezing (Upon onset which has now resloved). Negative for apnea, chest tightness and stridor.   Cardiovascular:  Negative for chest pain and palpitations.  Gastrointestinal: Negative.      Physical Exam Triage Vital Signs ED Triage Vitals  Enc Vitals Group     BP 05/26/22 1039 (!) 176/83     Pulse Rate 05/26/22 1039 74     Resp 05/26/22 1039 18     Temp 05/26/22 1039 98.5 F (36.9 C)     Temp Source 05/26/22 1039 Oral     SpO2 05/26/22 1039 94 %     Weight --      Height --      Head Circumference --      Peak Flow --      Pain Score 05/26/22 1040 5     Pain Loc --      Pain Edu? --      Excl. in [place]? --    No data found.  Updated Vital Signs BP (!) 176/83 (BP Location: Left Arm)   Pulse 74   Temp 98.5 F (36.9 C) (Oral)   Resp 18   LMP 05/25/1974   SpO2 94%   Physical Exam Vitals and nursing note reviewed.  Constitutional:      Appearance: Normal appearance.  HENT:     Right Ear: Hearing, tympanic membrane, ear canal and external ear normal.     Left Ear: Hearing, tympanic membrane, ear canal and external ear normal.  Cardiovascular:     Rate and Rhythm: Normal rate and regular rhythm.     Heart sounds: Normal heart sounds, S1 normal and S2 normal.   Pulmonary:     Effort: Pulmonary effort is normal.     Breath sounds: Examination of the right-lower field reveals decreased breath sounds. Examination of the left-lower field reveals decreased breath sounds. Decreased breath sounds present. No wheezing, rhonchi or rales.  Neurological:     Mental Status: She is alert.      UC Treatments / Results  Labs (all labs ordered are listed, but only abnormal results are displayed) Labs Reviewed - No data to display  EKG   Radiology DG Chest 2 View  Result Date: 05/26/2022 CLINICAL DATA:  Cough for 8 weeks EXAM: CHEST - 2 VIEW COMPARISON:  05/03/2020 FINDINGS: Mild cardiomegaly. Normal mediastinal contours. No focal pulmonary opacity. No pleural effusion or pneumothorax. Large hiatal hernia. No acute osseous abnormality. IMPRESSION: 1. Cardiomegaly with no additional acute cardiopulmonary process. 2. Large hiatal hernia. Electronically Signed   By: Merilyn Baba M.D.   On: 05/26/2022 11:32    Procedures Procedures (including critical care time)  Medications Ordered in UC Medications  albuterol (VENTOLIN HFA) 108 (90 Base) MCG/ACT inhaler 2 puff (2 puffs Inhalation Given 05/26/22 1152)    Initial Impression / Assessment and Plan / UC Course  I have reviewed the triage vital signs and the nursing notes.  Pertinent labs & imaging results that were available during my  care of the patient were reviewed by me and considered in my medical decision making (see chart for details).     Patient was evaluated for hoarseness, cough, and dyspnea on exertion.  Chest x-ray performed showed no acute cardiopulmonary disease.  Etiology of symptoms seems to be related to an inflammatory response from an initial viral illness.  Patient was given an albuterol inhaler in office, she was educated on use of the inhaler.  A taper of prednisone was sent to the pharmacy, patient was made aware of treatment regiment and possible side effects.  Patient was made aware  that she will need to monitor her blood sugars while on this medicine. This writer was unable to find a recent A1c within EPIC, patient states her last A1c was approximately 7%.  Patient reports that she has taken prednisone before with no adverse effect.  Patient was made aware of timeline for resolution of symptoms and when follow-up would be necessary.  Patient verbalized understanding of instructions.   Charting was provided using a a verbal dictation system, charting was proofread for errors, errors may occur which could change the meaning of the information charted.   Final Clinical Impressions(s) / UC Diagnoses   Final diagnoses:  Acute cough  Hoarseness of voice  Dyspnea on exertion     Discharge Instructions      Albuterol inhaler has been given to you in office today, you may use this every 4-6 hours as needed.   Prednisone taper has been sent to the pharmacy, you will take 2 tablets from days 1-3, then 1 tablet until day 6.   Please follow up with this office or with your PCP if your symptoms are not improving.   As discussed, please check your blood sugars throughout this time while you are taking the prednisone.      ED Prescriptions     Medication Sig Dispense Auth. Provider   predniSONE (DELTASONE) 20 MG tablet Take 2 tablets on days 1, 2, and 3 , then 1 tablet until day 6 9 tablet Flossie Dibble, NP      PDMP not reviewed this encounter.   Flossie Dibble, NP 05/26/22 1214    Flossie Dibble, NP 05/26/22 1215

## 2022-05-26 NOTE — Discharge Instructions (Addendum)
Albuterol inhaler has been given to you in office today, you may use this every 4-6 hours as needed.   Prednisone taper has been sent to the pharmacy, you will take 2 tablets from days 1-3, then 1 tablet until day 6.   Please follow up with this office or with your PCP if your symptoms are not improving.   As discussed, please check your blood sugars throughout this time while you are taking the prednisone.

## 2022-05-26 NOTE — ED Triage Notes (Signed)
Pt c/o cough, congestion, wheezing, and SOB for over 8wks. States getting worse in the past week. C/o loss of voice x1 wk. Taking OTC meds with no relief.

## 2022-06-24 ENCOUNTER — Ambulatory Visit: Payer: Medicare Other | Admitting: Vascular Surgery

## 2022-06-24 ENCOUNTER — Encounter: Payer: Self-pay | Admitting: Vascular Surgery

## 2022-06-24 VITALS — BP 128/76 | HR 69 | Temp 98.1°F | Resp 14 | Ht 61.0 in | Wt 148.0 lb

## 2022-06-24 DIAGNOSIS — I8393 Asymptomatic varicose veins of bilateral lower extremities: Secondary | ICD-10-CM

## 2022-06-24 NOTE — Progress Notes (Signed)
REASON FOR VISIT:   Follow-up of chronic venous insufficiency  MEDICAL ISSUES:   CHRONIC VENOUS DISEASE: This patient has CEAP C1 venous disease.  She has telangiectasias.  Her venous duplex scan shows no deep venous reflux and minimal superficial venous reflux.  We have discussed importance of intermittent leg elevation and the proper positioning for this.  I encouraged her to continue to wear her compression stockings.  We discussed the importance of exercise.  I encouraged her to avoid prolonged sitting and standing.  She does find her telangiectasias problematic and would like to consider sclerotherapy.  I think she would be a good candidate for sclerotherapy.  I have given her Alisha Harpin, RN's card and she will call to discuss this.  I will see her back as needed.  HPI:   Alisha Delgado is a pleasant 77 y.o. female who saw Dr. Carlis Delgado on 02/10/2022 with varicose veins.  Her veins on the left side were quite symptomatic.  She experienced significant aching pain and heaviness in her legs which is aggravated by standing and sitting and relieved with elevation.  She had superficial venous reflux but no significant deep venous reflux.  She was encouraged to elevate her legs.  She was prescribed thigh-high compression stockings with a gradient of 20 to 30 mmHg.  She was set up for 19-monthfollow-up visit.  On my history, the patient describes some aching pain and heaviness in the left leg which is aggravated by sitting and relieved with elevation.  The compression stockings do help some.  She has had no previous history of DVT.  She has had no previous venous procedures.  She does have some telangiectasias bilaterally and does not like the appearance of these.  Past Medical History:  Diagnosis Date   Anemia    Arthritis    Blood transfusion    [facility]    Cataract    Diabetes mellitus    Distal radius fracture, left 2015   Family history of breast cancer 04/25/2020   Family  history of ovarian cancer 04/25/2020   GERD (gastroesophageal reflux disease) 2012   Headache    migraines   Hypertension    Osteopenia 2019   Osteoporosis    [facility]  Pneumonia    Proteinuria 2016   Ribs, multiple fractures 2015   Ulcer     Family History  Problem Relation Age of Onset   Ovarian cancer Mother 726  Ovarian cancer Maternal Aunt 80   Breast cancer Maternal Aunt 90   Breast cancer Paternal Aunt        dx 760s  Brain cancer Paternal Grandfather 630  Ovarian cancer Cousin 433  Ovarian cancer Cousin        dx 722s  Breast cancer Cousin 675      maternal female cousin   Breast cancer Cousin        maternal female cousin; dx unknown age   Breast cancer Cousin        maternal female cousin; dx unknown age    SOCIAL HISTORY: Social History   Tobacco Use   Smoking status: Former    Packs/day: 0.50    Years: 2.00    Total pack years: 1.00    Types: Cigarettes    Quit date: 05/25/1988    Years since quitting: 34.1   Smokeless tobacco: Never  Substance Use Topics   Alcohol use: Yes  Comment: Rarely special occasions    Allergies  Allergen Reactions   Codeine Itching    With High Dose  Codeine   -  Causes  Itching.   Atorvastatin Other (See Comments)   Other Other (See Comments)   Pravastatin Other (See Comments)    Current Outpatient Medications  Medication Sig Dispense Refill   atorvastatin (LIPITOR) 20 MG tablet Take 20 mg by mouth daily with supper.      Calcium Carbonate-Vitamin D (CALCIUM 600 + D PO) Take 1 tablet by mouth in the morning and at bedtime.      FARXIGA 10 MG TABS tablet Take 10 mg by mouth daily.     ferrous sulfate 325 (65 FE) MG tablet Take 325 mg by mouth daily with lunch.      glimepiride (AMARYL) 4 MG tablet Take 4 mg by mouth daily with breakfast.     Glucose Blood (ACCU-CHEK AVIVA PLUS VI) USE TO TEST ONCE A DAY AS DIRECTED     glucose blood test strip See admin instructions.     Krill Oil 300 MG CAPS  Take 300 mg by mouth at bedtime.      Lancets (ACCU-CHEK MULTICLIX) lancets See admin instructions.     losartan-hydrochlorothiazide (HYZAAR) 100-12.5 MG tablet Take 1 tablet by mouth at bedtime.      metFORMIN (GLUCOPHAGE) 500 MG tablet Take 2,000 mg by mouth daily after supper.     Multiple Vitamin (MULTIVITAMIN WITH MINERALS) TABS tablet Take 1 tablet by mouth daily.     omeprazole (PRILOSEC) 20 MG capsule Take 20 mg by mouth daily as needed (acid reflux/indigestion.).      Polyethyl Glycol-Propyl Glycol (SYSTANE) 0.4-0.3 % SOLN Place 1 drop into both eyes 3 (three) times daily as needed (dry/irritated eyes).     rizatriptan (MAXALT) 10 MG tablet Take 10 mg by mouth 2 (two) times daily as needed for migraine.     tamoxifen (NOLVADEX) 20 MG tablet Take 1 tablet (20 mg total) by mouth daily. 90 tablet 3   vitamin B-12 (CYANOCOBALAMIN) 100 MCG tablet Take 100 mcg by mouth daily with lunch.      vitamin C (ASCORBIC ACID) 500 MG tablet Take 500 mg by mouth daily with lunch.      alendronate (FOSAMAX) 70 MG tablet Take 70 mg by mouth every Monday. Take with a full glass of water on an empty stomach.     meloxicam (MOBIC) 15 MG tablet Take 15 mg by mouth daily as needed (hip pain.).      predniSONE (DELTASONE) 20 MG tablet Take 2 tablets on days 1, 2, and 3 , then 1 tablet until day 6 9 tablet 0   No current facility-administered medications for this visit.    REVIEW OF SYSTEMS:  '[X]'$  denotes positive finding, '[ ]'$  denotes negative finding Cardiac  Comments:  Chest pain or chest pressure:    Shortness of breath upon exertion:    Short of breath when lying flat:    Irregular heart rhythm:        Vascular    Pain in calf, thigh, or hip brought on by ambulation:    Pain in feet at night that wakes you up from your sleep:     Blood clot in your veins:    Leg swelling:         Pulmonary    Oxygen at home:    Productive cough:     Wheezing:  Neurologic    Sudden weakness in arms or  legs:     Sudden numbness in arms or legs:     Sudden onset of difficulty speaking or slurred speech:    Temporary loss of vision in one eye:     Problems with dizziness:         Gastrointestinal    Blood in stool:     Vomited blood:         Genitourinary    Burning when urinating:     Blood in urine:        Psychiatric    Major depression:         Hematologic    Bleeding problems:    Problems with blood clotting too easily:        Skin    Rashes or ulcers:        Constitutional    Fever or chills:     PHYSICAL EXAM:   Vitals:   06/24/22 1422  BP: 128/76  Pulse: 69  Resp: 14  Temp: 98.1 F (36.7 C)  TempSrc: Temporal  SpO2: 95%  Weight: 148 lb (67.1 kg)  Height: '5\' 1"'$  (1.549 m)    GENERAL: The patient is a well-nourished female, in no acute distress. The vital signs are documented above. CARDIAC: There is a regular rate and rhythm.  VASCULAR: I do not detect carotid bruits. She has pedal pulses. She has some telangiectasias in both thighs and legs.     PULMONARY: There is good air exchange bilaterally without wheezing or rales. ABDOMEN: Soft and non-tender with normal pitched bowel sounds.  MUSCULOSKELETAL: There are no major deformities or cyanosis. NEUROLOGIC: No focal weakness or paresthesias are detected. SKIN: There are no ulcers or rashes noted. PSYCHIATRIC: The patient has a normal affect.  DATA:    VENOUS DUPLEX: I have reviewed the venous duplex scan that was done on 02/10/2022.  This was in the left lower extremity only.  There was no evidence of DVT.  There was no deep venous reflux.  There was superficial venous reflux in the left great saphenous vein from the saphenofemoral junction to the mid thigh.  The vein however was not especially dilated.  Diameters ranged from 2.8-3.4 mm.  There was no reflux in the small saphenous vein.  The results of the study are summarized on the diagram below.    Deit[facility] [phone]

## 2022-08-03 ENCOUNTER — Ambulatory Visit (INDEPENDENT_AMBULATORY_CARE_PROVIDER_SITE_OTHER): Payer: Self-pay

## 2022-08-03 DIAGNOSIS — I8393 Asymptomatic varicose veins of bilateral lower extremities: Secondary | ICD-10-CM

## 2022-08-03 NOTE — Progress Notes (Signed)
Treated pt's BLE spider and small reticular veins with Asclera 1% administered with a 27g butterfly.  Patient received a total of 8 mL (80 mg). Pt tolerated very well. Easy access, anticipate good results. Pt is aware she may want another treatment due to extensive spider veins she has. Post treatment care instructions given on handout and verbally. Pt will call if she has any questions/concerns.   Photos: Yes.    Compression stockings applied: Yes.

## 2022-09-25 ENCOUNTER — Telehealth: Payer: Self-pay

## 2022-09-25 NOTE — Telephone Encounter (Signed)
Called pt to f/u on sclerotherapy treatment. She is seeing definite improvement. She was pleased with results. Would like to schedule in the fall for another treatment.

## 2022-11-09 DIAGNOSIS — E785 Hyperlipidemia, unspecified: Secondary | ICD-10-CM | POA: Diagnosis not present

## 2022-11-09 DIAGNOSIS — Z0001 Encounter for general adult medical examination with abnormal findings: Secondary | ICD-10-CM | POA: Diagnosis not present

## 2022-11-09 DIAGNOSIS — Z0000 Encounter for general adult medical examination without abnormal findings: Secondary | ICD-10-CM | POA: Diagnosis not present

## 2022-11-09 DIAGNOSIS — E1169 Type 2 diabetes mellitus with other specified complication: Secondary | ICD-10-CM | POA: Diagnosis not present

## 2022-11-09 DIAGNOSIS — I10 Essential (primary) hypertension: Secondary | ICD-10-CM | POA: Diagnosis not present

## 2022-11-09 DIAGNOSIS — Z23 Encounter for immunization: Secondary | ICD-10-CM | POA: Diagnosis not present

## 2022-11-09 DIAGNOSIS — E1121 Type 2 diabetes mellitus with diabetic nephropathy: Secondary | ICD-10-CM | POA: Diagnosis not present

## 2022-11-09 DIAGNOSIS — D0512 Intraductal carcinoma in situ of left breast: Secondary | ICD-10-CM | POA: Diagnosis not present

## 2023-01-07 DIAGNOSIS — Z1231 Encounter for screening mammogram for malignant neoplasm of breast: Secondary | ICD-10-CM | POA: Diagnosis not present

## 2023-01-11 ENCOUNTER — Encounter: Payer: Self-pay | Admitting: Hematology and Oncology

## 2023-01-14 ENCOUNTER — Encounter: Payer: Self-pay | Admitting: Hematology and Oncology

## 2023-01-19 ENCOUNTER — Telehealth: Payer: Self-pay | Admitting: Hematology and Oncology

## 2023-01-19 NOTE — Telephone Encounter (Signed)
 Rescheduled appointment per provider PAL. Left voicemail with appointment details.

## 2023-01-21 DIAGNOSIS — R922 Inconclusive mammogram: Secondary | ICD-10-CM | POA: Diagnosis not present

## 2023-01-26 ENCOUNTER — Ambulatory Visit: Payer: Medicare Other | Admitting: Hematology and Oncology

## 2023-02-09 DIAGNOSIS — Z23 Encounter for immunization: Secondary | ICD-10-CM | POA: Diagnosis not present

## 2023-02-17 ENCOUNTER — Inpatient Hospital Stay: Payer: Medicare Other | Attending: Hematology and Oncology | Admitting: Hematology and Oncology

## 2023-02-17 ENCOUNTER — Encounter: Payer: Self-pay | Admitting: Hematology and Oncology

## 2023-02-17 VITALS — BP 176/95 | HR 79 | Temp 97.5°F | Resp 18 | Ht 61.0 in | Wt 161.4 lb

## 2023-02-17 DIAGNOSIS — Z79810 Long term (current) use of selective estrogen receptor modulators (SERMs): Secondary | ICD-10-CM | POA: Diagnosis not present

## 2023-02-17 DIAGNOSIS — D0512 Intraductal carcinoma in situ of left breast: Secondary | ICD-10-CM | POA: Diagnosis not present

## 2023-02-17 MED ORDER — TAMOXIFEN CITRATE 20 MG PO TABS: 20.0000 mg | ORAL_TABLET | Freq: Every day | ORAL | 3 refills | Status: DC

## 2023-02-17 NOTE — Progress Notes (Signed)
Patient Care Team: Lorenda Ishihara, MD as PCP - General (Internal Medicine) Pershing Proud, RN as Oncology Nurse Navigator Donnelly Angelica, RN as Oncology Nurse Navigator Abigail Miyamoto, MD as Consulting Physician (General Surgery) Dorothy Puffer, MD as Consulting Physician (Radiation Oncology) Charlott Rakes, MD as Consulting Physician (Gastroenterology) Serena Croissant, MD as Consulting Physician (Hematology and Oncology)  DIAGNOSIS:  Encounter Diagnosis  Name Primary?   Ductal carcinoma in situ (DCIS) of left breast Yes    SUMMARY OF ONCOLOGIC HISTORY: Oncology History  Ductal carcinoma in situ (DCIS) of left breast  04/23/2020 Initial Diagnosis   Ductal carcinoma in situ (DCIS) of left breast   05/04/2020 Genetic Testing   Negative genetic testing: no pathogenic variants detected in [facility] Common Hereditary Cancers Panel.  Variants of uncertain significance detected in APC c.6694C>G (p.His2232Asp), POLD1 c.632G>A (p.Arg211His), and RNF43 c.667C>T (p.Arg223Cys).  The report date is May 04, 2020.   The Common Hereditary Cancers Panel offered by [facility] includes sequencing and/or deletion duplication testing of the following 48 genes: APC, ATM, AXIN2, BARD1, BMPR1A, BRCA1, BRCA2, BRIP1, CDH1, CDK4, CDKN2A (p14ARF), CDKN2A (p16INK4a), CHEK2, CTNNA1, DICER1, EPCAM (Deletion/duplication testing only), GREM1 (promoter region deletion/duplication testing only), KIT, MEN1, MLH1, MSH2, MSH3, MSH6, MUTYH, NBN, NF1, NHTL1, PALB2, PDGFRA, PMS2, POLD1, POLE, PTEN, RAD50, RAD51C, RAD51D, RNF43, SDHB, SDHC, SDHD, SMAD4, SMARCA4. STK11, TP53, TSC1, TSC2, and VHL.  The following genes were evaluated for sequence changes only: SDHA and HOXB13 c.251G>A variant only.   05/07/2020 Surgery   lumpectomy   06/13/2020 - 07/10/2020 Radiation Therapy     08/06/2020 -  Anti-estrogen oral therapy   Tamoxifen     CHIEF COMPLIANT: Follow-up of DCIS on tamoxifen  Discussed the use of AI  scribe software for clinical note transcription with the patient, who gave verbal consent to proceed.  History of Present Illness   The patient, a breast cancer survivor, is nearly three years post-diagnosis and surgery. She reports doing "super well" on tamoxifen, with only minor side effects of leg cramps and increased facial hair. She manages the cramps with tonic water. Their recent mammograms were benign.      ALLERGIES:  is allergic to codeine, atorvastatin, other, and pravastatin.  MEDICATIONS:  Current Outpatient Medications  Medication Sig Dispense Refill   atorvastatin (LIPITOR) 20 MG tablet Take 20 mg by mouth daily with supper.      Calcium Carbonate-Vitamin D (CALCIUM 600 + D PO) Take 1 tablet by mouth in the morning and at bedtime.      FARXIGA 10 MG TABS tablet Take 10 mg by mouth daily.     ferrous sulfate 325 (65 FE) MG tablet Take 325 mg by mouth daily with lunch.      glimepiride (AMARYL) 4 MG tablet Take 4 mg by mouth daily with breakfast.     Glucose Blood (ACCU-CHEK AVIVA PLUS VI) USE TO TEST ONCE A DAY AS DIRECTED     glucose blood test strip See admin instructions.     Krill Oil 300 MG CAPS Take 300 mg by mouth at bedtime.      Lancets (ACCU-CHEK MULTICLIX) lancets See admin instructions.     losartan-hydrochlorothiazide (HYZAAR) 100-12.5 MG tablet Take 1 tablet by mouth at bedtime.      metFORMIN (GLUCOPHAGE) 500 MG tablet Take 2,000 mg by mouth daily after supper.     Multiple Vitamin (MULTIVITAMIN WITH MINERALS) TABS tablet Take 1 tablet by mouth daily.     omeprazole (PRILOSEC) 20 MG capsule Take 20  mg by mouth daily as needed (acid reflux/indigestion.).      Polyethyl Glycol-Propyl Glycol (SYSTANE) 0.4-0.3 % SOLN Place 1 drop into both eyes 3 (three) times daily as needed (dry/irritated eyes).     rizatriptan (MAXALT) 10 MG tablet Take 10 mg by mouth 2 (two) times daily as needed for migraine.     vitamin B-12 (CYANOCOBALAMIN) 100 MCG tablet Take 100 mcg by  mouth daily with lunch.      vitamin C (ASCORBIC ACID) 500 MG tablet Take 500 mg by mouth daily with lunch.      alendronate (FOSAMAX) 70 MG tablet Take 70 mg by mouth every Monday. Take with a full glass of water on an empty stomach.     tamoxifen (NOLVADEX) 20 MG tablet Take 1 tablet (20 mg total) by mouth daily. 90 tablet 3   No current facility-administered medications for this visit.    PHYSICAL EXAMINATION: ECOG PERFORMANCE STATUS: 1 - Symptomatic but completely ambulatory  Vitals:   02/17/23 1145  BP: (!) 176/95  Pulse: 79  Resp: 18  Temp: (!) 97.5 F (36.4 C)  SpO2: 96%   Filed Weights   02/17/23 1145  Weight: 161 lb 6.4 oz (73.2 kg)     LABORATORY DATA:  I have reviewed the data as listed    Latest Ref Rng & Units 05/03/2020    2:30 PM 04/24/2020   12:23 PM 07/21/2011    1:46 PM  CMP  Glucose 70 - 99 mg/dL 696  295  91   BUN 8 - 23 mg/dL 18  19  20    Creatinine 0.44 - 1.00 mg/dL 2.84  1.32  4.40   Sodium 135 - 145 mmol/L 137  138  140   Potassium 3.5 - 5.1 mmol/L 3.5  4.0  4.3   Chloride 98 - 111 mmol/L 98  103  106   CO2 22 - 32 mmol/L 28  26  22    Calcium 8.9 - 10.3 mg/dL 9.9  10.2  9.3   Total Protein 6.5 - 8.1 g/dL  7.1    Total Bilirubin 0.3 - 1.2 mg/dL  0.7    Alkaline Phos 38 - 126 U/L  85    AST 15 - 41 U/L  34    ALT 0 - 44 U/L  41      Lab Results  Component Value Date   WBC 7.3 05/03/2020   HGB 13.2 05/03/2020   HCT 39.7 05/03/2020   MCV 91.9 05/03/2020   PLT 325 05/03/2020   NEUTROABS 4.5 04/24/2020    ASSESSMENT & PLAN:  Ductal carcinoma in situ (DCIS) of left breast 04/09/2020: Left breast biopsy: DCIS high-grade ER/PR positive 05/07/2020: Left lumpectomy: 2.1 cm DCIS high-grade negative margins 05/04/2020: Genetics: Negative 06/13/2018 to 07/10/2020: Adjuvant radiation   Current treatment: Tamoxifen started 08/07/2018 Tamoxifen toxicities: Intermittent muscle cramps in the legs got better Occasional hot flashes: Tolerating it  well   Breast cancer surveillance: mammogram 01/07/2023 at [facility]: Benign breast density category C    Breast Cancer Nearly three years post-diagnosis and surgery. No issues reported from recent mammograms. Mild side effects from Tamoxifen (leg cramps, facial hair) but tolerable. -Continue Tamoxifen, manage side effects with tonic water and other measures. -Send a year's worth of Tamoxifen refills to [facility]. -Schedule follow-up appointment in one year to review mammogram results.          No orders of the defined types were placed in this encounter.  The patient has a  good understanding of the overall plan. she agrees with it. she will call with any problems that may develop before the next visit here. Total time spent: 30 mins including face to face time and time spent for planning, charting and co-ordination of care   Tamsen Meek, MD 02/17/23

## 2023-02-17 NOTE — Assessment & Plan Note (Signed)
04/09/2020: Left breast biopsy: DCIS high-grade ER/PR positive 05/07/2020: Left lumpectomy: 2.1 cm DCIS high-grade negative margins 05/04/2020: Genetics: Negative 06/13/2018 to 07/10/2020: Adjuvant radiation   Current treatment: Tamoxifen started 08/07/2018 Tamoxifen toxicities: Intermittent muscle cramps in the legs got better Occasional hot flashes: Tolerating it well   Breast cancer surveillance: mammogram 01/07/2023 at [facility]: Benign breast density category C   Return to clinic in 1 year for follow-up

## 2023-02-22 ENCOUNTER — Encounter: Payer: Self-pay | Admitting: Hematology and Oncology

## 2023-03-04 DIAGNOSIS — H2513 Age-related nuclear cataract, bilateral: Secondary | ICD-10-CM | POA: Diagnosis not present

## 2023-05-10 DIAGNOSIS — E1169 Type 2 diabetes mellitus with other specified complication: Secondary | ICD-10-CM | POA: Diagnosis not present

## 2023-05-10 DIAGNOSIS — E119 Type 2 diabetes mellitus without complications: Secondary | ICD-10-CM | POA: Diagnosis not present

## 2023-05-10 DIAGNOSIS — Z23 Encounter for immunization: Secondary | ICD-10-CM | POA: Diagnosis not present

## 2023-05-10 DIAGNOSIS — I10 Essential (primary) hypertension: Secondary | ICD-10-CM | POA: Diagnosis not present

## 2023-05-10 DIAGNOSIS — R252 Cramp and spasm: Secondary | ICD-10-CM | POA: Diagnosis not present

## 2023-11-15 DIAGNOSIS — Z0000 Encounter for general adult medical examination without abnormal findings: Secondary | ICD-10-CM | POA: Diagnosis not present

## 2023-11-15 DIAGNOSIS — M8588 Other specified disorders of bone density and structure, other site: Secondary | ICD-10-CM | POA: Diagnosis not present

## 2023-11-15 DIAGNOSIS — Z8711 Personal history of peptic ulcer disease: Secondary | ICD-10-CM | POA: Diagnosis not present

## 2023-11-15 DIAGNOSIS — E785 Hyperlipidemia, unspecified: Secondary | ICD-10-CM | POA: Diagnosis not present

## 2023-11-15 DIAGNOSIS — E1169 Type 2 diabetes mellitus with other specified complication: Secondary | ICD-10-CM | POA: Diagnosis not present

## 2023-11-15 DIAGNOSIS — I10 Essential (primary) hypertension: Secondary | ICD-10-CM | POA: Diagnosis not present

## 2023-11-15 DIAGNOSIS — D0512 Intraductal carcinoma in situ of left breast: Secondary | ICD-10-CM | POA: Diagnosis not present

## 2023-11-15 DIAGNOSIS — D8481 Immunodeficiency due to conditions classified elsewhere: Secondary | ICD-10-CM | POA: Diagnosis not present

## 2023-11-15 DIAGNOSIS — Z23 Encounter for immunization: Secondary | ICD-10-CM | POA: Diagnosis not present

## 2023-11-15 DIAGNOSIS — Z803 Family history of malignant neoplasm of breast: Secondary | ICD-10-CM | POA: Diagnosis not present

## 2023-11-16 ENCOUNTER — Other Ambulatory Visit (HOSPITAL_BASED_OUTPATIENT_CLINIC_OR_DEPARTMENT_OTHER): Payer: Self-pay | Admitting: Internal Medicine

## 2023-11-16 DIAGNOSIS — M8588 Other specified disorders of bone density and structure, other site: Secondary | ICD-10-CM

## 2024-01-13 DIAGNOSIS — Z1231 Encounter for screening mammogram for malignant neoplasm of breast: Secondary | ICD-10-CM | POA: Diagnosis not present

## 2024-01-20 ENCOUNTER — Encounter: Payer: Self-pay | Admitting: Hematology and Oncology

## 2024-01-26 DIAGNOSIS — R928 Other abnormal and inconclusive findings on diagnostic imaging of breast: Secondary | ICD-10-CM | POA: Diagnosis not present

## 2024-01-26 DIAGNOSIS — R92331 Mammographic heterogeneous density, right breast: Secondary | ICD-10-CM | POA: Diagnosis not present

## 2024-01-31 ENCOUNTER — Encounter: Payer: Self-pay | Admitting: Hematology and Oncology

## 2024-02-17 ENCOUNTER — Inpatient Hospital Stay: Payer: Medicare Other | Attending: Hematology and Oncology | Admitting: Hematology and Oncology

## 2024-02-17 VITALS — BP 141/76 | HR 80 | Temp 98.4°F | Resp 17 | Wt 158.9 lb

## 2024-02-17 DIAGNOSIS — D0512 Intraductal carcinoma in situ of left breast: Secondary | ICD-10-CM | POA: Diagnosis not present

## 2024-02-17 DIAGNOSIS — Z923 Personal history of irradiation: Secondary | ICD-10-CM | POA: Diagnosis not present

## 2024-02-17 DIAGNOSIS — Z79810 Long term (current) use of selective estrogen receptor modulators (SERMs): Secondary | ICD-10-CM | POA: Insufficient documentation

## 2024-02-17 DIAGNOSIS — R232 Flushing: Secondary | ICD-10-CM | POA: Diagnosis not present

## 2024-02-17 NOTE — Progress Notes (Signed)
 Patient Care Team: Elliot Charm, MD as PCP - General (Internal Medicine) Tyree Nanetta SAILOR, RN as Oncology Nurse Navigator Vernetta Berg, MD as Consulting Physician (General Surgery) Dewey Rush, MD as Consulting Physician (Radiation Oncology) Dianna Specking, MD as Consulting Physician (Gastroenterology) Odean Potts, MD as Consulting Physician (Hematology and Oncology)  DIAGNOSIS:  Encounter Diagnosis  Name Primary?   Ductal carcinoma in situ (DCIS) of left breast Yes    SUMMARY OF ONCOLOGIC HISTORY: Oncology History  Ductal carcinoma in situ (DCIS) of left breast  04/23/2020 Initial Diagnosis   Ductal carcinoma in situ (DCIS) of left breast   05/04/2020 Genetic Testing   Negative genetic testing: no pathogenic variants detected in [facility] Common Hereditary Cancers Panel.  Variants of uncertain significance detected in APC c.6694C>G (p.His2232Asp), POLD1 c.632G>A (p.Arg211His), and RNF43 c.667C>T (p.Arg223Cys).  The report date is May 04, 2020.   The Common Hereditary Cancers Panel offered by [facility] includes sequencing and/or deletion duplication testing of the following 48 genes: APC, ATM, AXIN2, BARD1, BMPR1A, BRCA1, BRCA2, BRIP1, CDH1, CDK4, CDKN2A (p14ARF), CDKN2A (p16INK4a), CHEK2, CTNNA1, DICER1, EPCAM (Deletion/duplication testing only), GREM1 (promoter region deletion/duplication testing only), KIT, MEN1, MLH1, MSH2, MSH3, MSH6, MUTYH, NBN, NF1, NHTL1, PALB2, PDGFRA, PMS2, POLD1, POLE, PTEN, RAD50, RAD51C, RAD51D, RNF43, SDHB, SDHC, SDHD, SMAD4, SMARCA4. STK11, TP53, TSC1, TSC2, and VHL.  The following genes were evaluated for sequence changes only: SDHA and HOXB13 c.251G>A variant only.   05/07/2020 Surgery   lumpectomy   06/13/2020 - 07/10/2020 Radiation Therapy     08/06/2020 -  Anti-estrogen oral therapy   Tamoxifen      CHIEF COMPLIANT: Surveillance of breast cancer on tamoxifen   HISTORY OF PRESENT ILLNESS:  History of Present  Illness Alisha Delgado is a 78 year old female with a history of breast cancer who presents for follow-up regarding her medication regimen.  She has been taking tamoxifen  for five years and has not discontinued it yet. She experienced a leg cramp this morning at 5:45 AM, which required her to walk around. She is looking forward to stopping tamoxifen .  She is currently on a break from Fosamax and plans to resume it in a couple of years. She has recently had a mammogram.     ALLERGIES:  is allergic to codeine, atorvastatin, other, and pravastatin.  MEDICATIONS:  Current Outpatient Medications  Medication Sig Dispense Refill   alendronate (FOSAMAX) 70 MG tablet Take 70 mg by mouth every Monday. Take with a full glass of water on an empty stomach.     atorvastatin (LIPITOR) 20 MG tablet Take 20 mg by mouth daily with supper.      Calcium Carbonate-Vitamin D (CALCIUM 600 + D PO) Take 1 tablet by mouth in the morning and at bedtime.      FARXIGA 10 MG TABS tablet Take 10 mg by mouth daily.     ferrous sulfate 325 (65 FE) MG tablet Take 325 mg by mouth daily with lunch.      glimepiride (AMARYL) 4 MG tablet Take 4 mg by mouth daily with breakfast.     Glucose Blood (ACCU-CHEK AVIVA PLUS VI) USE TO TEST ONCE A DAY AS DIRECTED     glucose blood test strip See admin instructions.     Krill Oil 300 MG CAPS Take 300 mg by mouth at bedtime.      Lancets (ACCU-CHEK MULTICLIX) lancets See admin instructions.     losartan-hydrochlorothiazide (HYZAAR) 100-12.5 MG tablet Take 1 tablet by mouth at bedtime.  metFORMIN (GLUCOPHAGE) 500 MG tablet Take 2,000 mg by mouth daily after supper.     Multiple Vitamin (MULTIVITAMIN WITH MINERALS) TABS tablet Take 1 tablet by mouth daily.     omeprazole (PRILOSEC) 20 MG capsule Take 20 mg by mouth daily as needed (acid reflux/indigestion.).      Polyethyl Glycol-Propyl Glycol (SYSTANE) 0.4-0.3 % SOLN Place 1 drop into both eyes 3 (three) times daily as needed  (dry/irritated eyes).     rizatriptan (MAXALT) 10 MG tablet Take 10 mg by mouth 2 (two) times daily as needed for migraine.     tamoxifen  (NOLVADEX ) 20 MG tablet Take 1 tablet (20 mg total) by mouth daily. 90 tablet 3   vitamin B-12 (CYANOCOBALAMIN) 100 MCG tablet Take 100 mcg by mouth daily with lunch.      vitamin C (ASCORBIC ACID) 500 MG tablet Take 500 mg by mouth daily with lunch.      No current facility-administered medications for this visit.    PHYSICAL EXAMINATION: ECOG PERFORMANCE STATUS: 1 - Symptomatic but completely ambulatory  Vitals:   02/17/24 1152  BP: (!) 141/76  Pulse: 80  Resp: 17  Temp: 98.4 F (36.9 C)  SpO2: 96%   Filed Weights   02/17/24 1152  Weight: 158 lb 14.4 oz (72.1 kg)      LABORATORY DATA:  I have reviewed the data as listed    Latest Ref Rng & Units 05/03/2020    2:30 PM 04/24/2020   12:23 PM 07/21/2011    1:46 PM  CMP  Glucose 70 - 99 mg/dL 861  763  91   BUN 8 - 23 mg/dL 18  19  20    Creatinine 0.44 - 1.00 mg/dL 9.26  9.17  9.20   Sodium 135 - 145 mmol/L 137  138  140   Potassium 3.5 - 5.1 mmol/L 3.5  4.0  4.3   Chloride 98 - 111 mmol/L 98  103  106   CO2 22 - 32 mmol/L 28  26  22    Calcium 8.9 - 10.3 mg/dL 9.9  89.2  9.3   Total Protein 6.5 - 8.1 g/dL  7.1    Total Bilirubin 0.3 - 1.2 mg/dL  0.7    Alkaline Phos 38 - 126 U/L  85    AST 15 - 41 U/L  34    ALT 0 - 44 U/L  41      Lab Results  Component Value Date   WBC 7.3 05/03/2020   HGB 13.2 05/03/2020   HCT 39.7 05/03/2020   MCV 91.9 05/03/2020   PLT 325 05/03/2020   NEUTROABS 4.5 04/24/2020    ASSESSMENT & PLAN:  Ductal carcinoma in situ (DCIS) of left breast 06/10/2019: Left breast biopsy: DCIS high-grade ER/PR positive 05/07/2020: Left lumpectomy: 2.1 cm DCIS high-grade negative margins 05/04/2020: Genetics: Negative 06/13/2018 to 07/10/2020: Adjuvant radiation   Current treatment: Tamoxifen  started 08/07/2018 completed 02/17/2024 Tamoxifen   toxicities: Intermittent muscle cramps in the legs got better Occasional hot flashes: Tolerating it well   I recommended that she discontinue antiestrogen therapy at this time.  Breast cancer surveillance: mammogram 01/26/2024 at [facility]: Benign breast density category C    Return to clinic on an as-needed basis      No orders of the defined types were placed in this encounter.  The patient has a good understanding of the overall plan. she agrees with it. she will call with any problems that may develop before the next visit here. Total time  spent: 30 mins including face to face time and time spent for planning, charting and co-ordination of care   Viinay K Aurianna Earlywine, MD 02/17/24

## 2024-02-17 NOTE — Assessment & Plan Note (Signed)
 06/10/2019: Left breast biopsy: DCIS high-grade ER/PR positive 05/07/2020: Left lumpectomy: 2.1 cm DCIS high-grade negative margins 05/04/2020: Genetics: Negative 06/13/2018 to 07/10/2020: Adjuvant radiation   Current treatment: Tamoxifen  started 08/07/2018 Tamoxifen  toxicities: Intermittent muscle cramps in the legs got better Occasional hot flashes: Tolerating it well   Breast cancer surveillance: mammogram 01/26/2024 at [facility]: Benign breast density category C Breast exam 02/17/2024: Benign  Return to clinic in 1 year for follow-up

## 2024-03-23 DIAGNOSIS — H52223 Regular astigmatism, bilateral: Secondary | ICD-10-CM | POA: Diagnosis not present
